# Patient Record
Sex: Female | Born: 1955 | Race: Black or African American | Hispanic: No | Smoking: Never smoker
Health system: Southern US, Community
[De-identification: ages and names within clinical notes are randomized; demographics above are authoritative.]

## PROBLEM LIST (undated history)

## (undated) DIAGNOSIS — F259 Schizoaffective disorder, unspecified: Secondary | ICD-10-CM

## (undated) DIAGNOSIS — D631 Anemia in chronic kidney disease: Secondary | ICD-10-CM

## (undated) DIAGNOSIS — F419 Anxiety disorder, unspecified: Secondary | ICD-10-CM

## (undated) DIAGNOSIS — E1169 Type 2 diabetes mellitus with other specified complication: Secondary | ICD-10-CM

## (undated) DIAGNOSIS — N189 Chronic kidney disease, unspecified: Secondary | ICD-10-CM

## (undated) DIAGNOSIS — E119 Type 2 diabetes mellitus without complications: Secondary | ICD-10-CM

## (undated) DIAGNOSIS — Z794 Long term (current) use of insulin: Secondary | ICD-10-CM

## (undated) DIAGNOSIS — F32A Depression, unspecified: Secondary | ICD-10-CM

## (undated) DIAGNOSIS — E1159 Type 2 diabetes mellitus with other circulatory complications: Secondary | ICD-10-CM

## (undated) DIAGNOSIS — I5032 Chronic diastolic (congestive) heart failure: Secondary | ICD-10-CM

## (undated) DIAGNOSIS — N183 Chronic kidney disease, stage 3 unspecified: Secondary | ICD-10-CM

## (undated) DIAGNOSIS — I152 Hypertension secondary to endocrine disorders: Secondary | ICD-10-CM

---

## 2016-02-18 HISTORY — PX: OTHER SURGICAL HISTORY: SHX169

## 2016-11-04 DIAGNOSIS — N1832 Chronic kidney disease, stage 3b: Secondary | ICD-10-CM | POA: Diagnosis present

## 2017-03-09 DIAGNOSIS — E119 Type 2 diabetes mellitus without complications: Secondary | ICD-10-CM

## 2017-03-09 DIAGNOSIS — E1159 Type 2 diabetes mellitus with other circulatory complications: Secondary | ICD-10-CM | POA: Diagnosis present

## 2017-03-09 DIAGNOSIS — I152 Hypertension secondary to endocrine disorders: Secondary | ICD-10-CM | POA: Diagnosis present

## 2019-11-22 ENCOUNTER — Emergency Department: Payer: Medicaid Other

## 2019-11-22 ENCOUNTER — Inpatient Hospital Stay
Admission: EM | Admit: 2019-11-22 | Discharge: 2019-11-27 | DRG: 177 | Disposition: A | Discharge status: Home Health | Payer: Medicaid Other | Attending: Internal Medicine | Admitting: Internal Medicine

## 2019-11-22 ENCOUNTER — Other Ambulatory Visit: Payer: Self-pay

## 2019-11-22 DIAGNOSIS — N141 Nephropathy induced by other drugs, medicaments and biological substances: Secondary | ICD-10-CM | POA: Diagnosis not present

## 2019-11-22 DIAGNOSIS — Z833 Family history of diabetes mellitus: Secondary | ICD-10-CM

## 2019-11-22 DIAGNOSIS — L8990 Pressure ulcer of unspecified site, unspecified stage: Secondary | ICD-10-CM | POA: Insufficient documentation

## 2019-11-22 DIAGNOSIS — E1169 Type 2 diabetes mellitus with other specified complication: Secondary | ICD-10-CM | POA: Diagnosis present

## 2019-11-22 DIAGNOSIS — E1122 Type 2 diabetes mellitus with diabetic chronic kidney disease: Secondary | ICD-10-CM | POA: Diagnosis present

## 2019-11-22 DIAGNOSIS — I152 Hypertension secondary to endocrine disorders: Secondary | ICD-10-CM | POA: Diagnosis present

## 2019-11-22 DIAGNOSIS — I5033 Acute on chronic diastolic (congestive) heart failure: Secondary | ICD-10-CM | POA: Diagnosis present

## 2019-11-22 DIAGNOSIS — Z79899 Other long term (current) drug therapy: Secondary | ICD-10-CM

## 2019-11-22 DIAGNOSIS — Z794 Long term (current) use of insulin: Secondary | ICD-10-CM

## 2019-11-22 DIAGNOSIS — Z6841 Body Mass Index (BMI) 40.0 and over, adult: Secondary | ICD-10-CM | POA: Diagnosis not present

## 2019-11-22 DIAGNOSIS — T508X5A Adverse effect of diagnostic agents, initial encounter: Secondary | ICD-10-CM | POA: Diagnosis not present

## 2019-11-22 DIAGNOSIS — U071 COVID-19: Principal | ICD-10-CM

## 2019-11-22 DIAGNOSIS — J189 Pneumonia, unspecified organism: Secondary | ICD-10-CM

## 2019-11-22 DIAGNOSIS — F419 Anxiety disorder, unspecified: Secondary | ICD-10-CM | POA: Diagnosis present

## 2019-11-22 DIAGNOSIS — L89623 Pressure ulcer of left heel, stage 3: Secondary | ICD-10-CM | POA: Diagnosis present

## 2019-11-22 DIAGNOSIS — I130 Hypertensive heart and chronic kidney disease with heart failure and stage 1 through stage 4 chronic kidney disease, or unspecified chronic kidney disease: Secondary | ICD-10-CM | POA: Diagnosis present

## 2019-11-22 DIAGNOSIS — R55 Syncope and collapse: Secondary | ICD-10-CM | POA: Diagnosis present

## 2019-11-22 DIAGNOSIS — J1282 Pneumonia due to coronavirus disease 2019: Secondary | ICD-10-CM | POA: Diagnosis present

## 2019-11-22 DIAGNOSIS — F259 Schizoaffective disorder, unspecified: Secondary | ICD-10-CM | POA: Diagnosis present

## 2019-11-22 DIAGNOSIS — J9601 Acute respiratory failure with hypoxia: Secondary | ICD-10-CM

## 2019-11-22 DIAGNOSIS — F32A Depression, unspecified: Secondary | ICD-10-CM | POA: Diagnosis present

## 2019-11-22 DIAGNOSIS — X58XXXA Exposure to other specified factors, initial encounter: Secondary | ICD-10-CM | POA: Diagnosis present

## 2019-11-22 DIAGNOSIS — N1832 Chronic kidney disease, stage 3b: Secondary | ICD-10-CM | POA: Diagnosis present

## 2019-11-22 DIAGNOSIS — R319 Hematuria, unspecified: Secondary | ICD-10-CM | POA: Diagnosis not present

## 2019-11-22 DIAGNOSIS — E1165 Type 2 diabetes mellitus with hyperglycemia: Secondary | ICD-10-CM | POA: Diagnosis present

## 2019-11-22 DIAGNOSIS — D638 Anemia in other chronic diseases classified elsewhere: Secondary | ICD-10-CM | POA: Diagnosis present

## 2019-11-22 DIAGNOSIS — E1159 Type 2 diabetes mellitus with other circulatory complications: Secondary | ICD-10-CM | POA: Diagnosis present

## 2019-11-22 DIAGNOSIS — N179 Acute kidney failure, unspecified: Secondary | ICD-10-CM | POA: Diagnosis not present

## 2019-11-22 DIAGNOSIS — I5031 Acute diastolic (congestive) heart failure: Secondary | ICD-10-CM | POA: Diagnosis not present

## 2019-11-22 DIAGNOSIS — E119 Type 2 diabetes mellitus without complications: Secondary | ICD-10-CM

## 2019-11-22 DIAGNOSIS — E785 Hyperlipidemia, unspecified: Secondary | ICD-10-CM | POA: Diagnosis present

## 2019-11-22 HISTORY — DX: Chronic diastolic (congestive) heart failure: I50.32

## 2019-11-22 HISTORY — DX: Chronic kidney disease, stage 3 unspecified: N18.30

## 2019-11-22 HISTORY — DX: Anemia in chronic kidney disease: D63.1

## 2019-11-22 HISTORY — DX: Anxiety disorder, unspecified: F41.9

## 2019-11-22 HISTORY — DX: Type 2 diabetes mellitus without complications: E11.9

## 2019-11-22 HISTORY — DX: Depression, unspecified: F32.A

## 2019-11-22 HISTORY — DX: Hypertension secondary to endocrine disorders: I15.2

## 2019-11-22 HISTORY — DX: Anemia in chronic kidney disease: N18.9

## 2019-11-22 HISTORY — DX: Schizoaffective disorder, unspecified: F25.9

## 2019-11-22 HISTORY — DX: Type 2 diabetes mellitus with other circulatory complications: E11.59

## 2019-11-22 HISTORY — DX: Type 2 diabetes mellitus with other specified complication: E11.69

## 2019-11-22 HISTORY — DX: Long term (current) use of insulin: Z79.4

## 2019-11-22 LAB — COMPREHENSIVE METABOLIC PANEL
ALT: 11 U/L (ref 0–44)
AST: 17 U/L (ref 15–41)
Albumin: 3.2 g/dL — ABNORMAL LOW (ref 3.5–5.0)
Alkaline Phosphatase: 51 U/L (ref 38–126)
Anion gap: 10 (ref 5–15)
BUN: 25 mg/dL — ABNORMAL HIGH (ref 8–23)
CO2: 26 mmol/L (ref 22–32)
Calcium: 8.9 mg/dL (ref 8.9–10.3)
Chloride: 102 mmol/L (ref 98–111)
Creatinine, Ser: 1.79 mg/dL — ABNORMAL HIGH (ref 0.44–1.00)
GFR, Estimated: 32 mL/min — ABNORMAL LOW (ref >60)
Glucose, Bld: 485 mg/dL — ABNORMAL HIGH (ref 70–99)
Potassium: 4.5 mmol/L (ref 3.5–5.1)
Sodium: 138 mmol/L (ref 135–145)
Total Bilirubin: 0.8 mg/dL (ref 0.3–1.2)
Total Protein: 7.3 g/dL (ref 6.5–8.1)

## 2019-11-22 LAB — CBC WITH DIFFERENTIAL/PLATELET
Abs Immature Granulocytes: 0.04 10*3/uL (ref 0.00–0.07)
Basophils Absolute: 0 10*3/uL (ref 0.0–0.1)
Basophils Relative: 0 %
Eosinophils Absolute: 0.1 10*3/uL (ref 0.0–0.5)
Eosinophils Relative: 1 %
HCT: 29.5 % — ABNORMAL LOW (ref 36.0–46.0)
Hemoglobin: 9 g/dL — ABNORMAL LOW (ref 12.0–15.0)
Immature Granulocytes: 1 %
Lymphocytes Relative: 25 %
Lymphs Abs: 2 10*3/uL (ref 0.7–4.0)
MCH: 26.8 pg (ref 26.0–34.0)
MCHC: 30.5 g/dL (ref 30.0–36.0)
MCV: 87.8 fL (ref 80.0–100.0)
Monocytes Absolute: 0.6 10*3/uL (ref 0.1–1.0)
Monocytes Relative: 7 %
Neutro Abs: 5.4 10*3/uL (ref 1.7–7.7)
Neutrophils Relative %: 66 %
Platelets: 294 10*3/uL (ref 150–400)
RBC: 3.36 MIL/uL — ABNORMAL LOW (ref 3.87–5.11)
RDW: 16.2 % — ABNORMAL HIGH (ref 11.5–15.5)
WBC: 8 10*3/uL (ref 4.0–10.5)
nRBC: 0 % (ref 0.0–0.2)

## 2019-11-22 LAB — FIBRINOGEN: Fibrinogen: 519 mg/dL — ABNORMAL HIGH (ref 210–475)

## 2019-11-22 LAB — FIBRIN DERIVATIVES D-DIMER (ARMC ONLY): Fibrin derivatives D-dimer (ARMC): 1385.48 ng/mL (FEU) — ABNORMAL HIGH (ref 0.00–499.00)

## 2019-11-22 LAB — LACTATE DEHYDROGENASE: LDH: 250 U/L — ABNORMAL HIGH (ref 98–192)

## 2019-11-22 LAB — PROTIME-INR
INR: 0.9 (ref 0.8–1.2)
Prothrombin Time: 11.9 seconds (ref 11.4–15.2)

## 2019-11-22 LAB — PROCALCITONIN: Procalcitonin: <0.1 ng/mL

## 2019-11-22 LAB — LACTIC ACID, PLASMA: Lactic Acid, Venous: 1.4 mmol/L (ref 0.5–1.9)

## 2019-11-22 LAB — BRAIN NATRIURETIC PEPTIDE: B Natriuretic Peptide: 269.3 pg/mL — ABNORMAL HIGH (ref 0.0–100.0)

## 2019-11-22 LAB — FERRITIN: Ferritin: 86 ng/mL (ref 11–307)

## 2019-11-22 LAB — TRIGLYCERIDES: Triglycerides: 161 mg/dL — ABNORMAL HIGH (ref <150)

## 2019-11-22 LAB — APTT: aPTT: 28 seconds (ref 24–36)

## 2019-11-22 MED ORDER — HYDROCOD POLST-CPM POLST ER 10-8 MG/5ML PO SUER: 5.0000 mL | Freq: Two times a day (BID) | ORAL | Status: DC | PRN

## 2019-11-22 MED ORDER — SODIUM CHLORIDE 0.9 % IV SOLN
500.0000 mg | INTRAVENOUS | Status: DC
  Administered 2019-11-22: 500 mg via INTRAVENOUS
  Filled 2019-11-22: qty 500

## 2019-11-22 MED ORDER — ONDANSETRON HCL 4 MG PO TABS: 4.0000 mg | ORAL_TABLET | Freq: Four times a day (QID) | ORAL | Status: DC | PRN

## 2019-11-22 MED ORDER — ENOXAPARIN SODIUM 40 MG/0.4ML ~~LOC~~ SOLN: 40.0000 mg | SUBCUTANEOUS | Status: DC

## 2019-11-22 MED ORDER — SODIUM CHLORIDE 0.9 % IV BOLUS (SEPSIS)
1000.0000 mL | Freq: Once | INTRAVENOUS | Status: AC
  Administered 2019-11-22: 1000 mL via INTRAVENOUS

## 2019-11-22 MED ORDER — SODIUM CHLORIDE 0.9% FLUSH: 3.0000 mL | Freq: Two times a day (BID) | INTRAVENOUS | Status: DC

## 2019-11-22 MED ORDER — IPRATROPIUM-ALBUTEROL 20-100 MCG/ACT IN AERS
1.0000 | INHALATION_SPRAY | Freq: Four times a day (QID) | RESPIRATORY_TRACT | Status: DC | PRN
  Filled 2019-11-22 (×2): qty 4

## 2019-11-22 MED ORDER — ACETAMINOPHEN 650 MG RE SUPP: 650.0000 mg | Freq: Four times a day (QID) | RECTAL | Status: DC | PRN

## 2019-11-22 MED ORDER — GUAIFENESIN-DM 100-10 MG/5ML PO SYRP: 5.0000 mL | ORAL_SOLUTION | ORAL | Status: DC | PRN

## 2019-11-22 MED ORDER — LABETALOL HCL 5 MG/ML IV SOLN
10.0000 mg | Freq: Once | INTRAVENOUS | Status: AC
  Administered 2019-11-23: 10 mg via INTRAVENOUS
  Filled 2019-11-22: qty 4

## 2019-11-22 MED ORDER — RISPERIDONE 1 MG PO TABS
1.0000 mg | ORAL_TABLET | Freq: Two times a day (BID) | ORAL | Status: DC
  Administered 2019-11-23 – 2019-11-27 (×9): 1 mg via ORAL
  Filled 2019-11-22 (×10): qty 1

## 2019-11-22 MED ORDER — IOHEXOL 350 MG/ML SOLN
60.0000 mL | Freq: Once | INTRAVENOUS | Status: AC | PRN
  Administered 2019-11-22: 60 mL via INTRAVENOUS

## 2019-11-22 MED ORDER — METHYLPREDNISOLONE SODIUM SUCC 125 MG IJ SOLR
60.0000 mg | Freq: Two times a day (BID) | INTRAMUSCULAR | Status: DC
  Administered 2019-11-23: 60 mg via INTRAVENOUS
  Filled 2019-11-22: qty 2

## 2019-11-22 MED ORDER — ASCORBIC ACID 500 MG PO TABS
1000.0000 mg | ORAL_TABLET | Freq: Every day | ORAL | Status: DC
  Administered 2019-11-23 – 2019-11-27 (×5): 1000 mg via ORAL
  Filled 2019-11-22 (×5): qty 2

## 2019-11-22 MED ORDER — ZINC SULFATE 220 (50 ZN) MG PO CAPS
220.0000 mg | ORAL_CAPSULE | Freq: Every day | ORAL | Status: DC
  Administered 2019-11-23 – 2019-11-27 (×5): 220 mg via ORAL
  Filled 2019-11-22 (×5): qty 1

## 2019-11-22 MED ORDER — METHYLPREDNISOLONE SODIUM SUCC 125 MG IJ SOLR
125.0000 mg | Freq: Once | INTRAMUSCULAR | Status: AC
  Administered 2019-11-22: 125 mg via INTRAVENOUS
  Filled 2019-11-22: qty 2

## 2019-11-22 MED ORDER — FUROSEMIDE 10 MG/ML IJ SOLN
40.0000 mg | Freq: Two times a day (BID) | INTRAMUSCULAR | Status: DC
  Administered 2019-11-23: 40 mg via INTRAVENOUS
  Filled 2019-11-22: qty 4

## 2019-11-22 MED ORDER — ONDANSETRON HCL 4 MG/2ML IJ SOLN: 4.0000 mg | Freq: Four times a day (QID) | INTRAMUSCULAR | Status: DC | PRN

## 2019-11-22 MED ORDER — CARVEDILOL 12.5 MG PO TABS
12.5000 mg | ORAL_TABLET | Freq: Two times a day (BID) | ORAL | Status: DC
  Administered 2019-11-23 – 2019-11-27 (×9): 12.5 mg via ORAL
  Filled 2019-11-22: qty 2
  Filled 2019-11-22 (×8): qty 1

## 2019-11-22 MED ORDER — VENLAFAXINE HCL ER 75 MG PO CP24
75.0000 mg | ORAL_CAPSULE | Freq: Every day | ORAL | Status: DC
  Administered 2019-11-23 – 2019-11-27 (×5): 75 mg via ORAL
  Filled 2019-11-22 (×5): qty 1

## 2019-11-22 MED ORDER — DIVALPROEX SODIUM ER 500 MG PO TB24
1000.0000 mg | ORAL_TABLET | Freq: Every day | ORAL | Status: DC
  Administered 2019-11-23 – 2019-11-26 (×5): 1000 mg via ORAL
  Filled 2019-11-22 (×4): qty 2
  Filled 2019-11-22: qty 4
  Filled 2019-11-22: qty 2

## 2019-11-22 MED ORDER — ATORVASTATIN CALCIUM 20 MG PO TABS
80.0000 mg | ORAL_TABLET | Freq: Every day | ORAL | Status: DC
  Administered 2019-11-23 – 2019-11-26 (×5): 80 mg via ORAL
  Filled 2019-11-22 (×5): qty 4

## 2019-11-22 MED ORDER — INSULIN GLARGINE 100 UNIT/ML ~~LOC~~ SOLN
50.0000 [IU] | Freq: Every day | SUBCUTANEOUS | Status: DC
  Administered 2019-11-23: 50 [IU] via SUBCUTANEOUS
  Filled 2019-11-22 (×2): qty 0.5

## 2019-11-22 MED ORDER — LISINOPRIL 20 MG PO TABS
40.0000 mg | ORAL_TABLET | Freq: Every day | ORAL | Status: DC
  Administered 2019-11-23 – 2019-11-24 (×2): 40 mg via ORAL
  Filled 2019-11-22: qty 4
  Filled 2019-11-22: qty 2

## 2019-11-22 MED ORDER — INSULIN ASPART 100 UNIT/ML ~~LOC~~ SOLN
0.0000 [IU] | Freq: Every day | SUBCUTANEOUS | Status: DC
  Administered 2019-11-23: 5 [IU] via SUBCUTANEOUS
  Administered 2019-11-24: 4 [IU] via SUBCUTANEOUS
  Administered 2019-11-25 – 2019-11-26 (×2): 2 [IU] via SUBCUTANEOUS
  Filled 2019-11-22 (×4): qty 1

## 2019-11-22 MED ORDER — SODIUM CHLORIDE 0.9 % IV SOLN
2.0000 g | INTRAVENOUS | Status: DC
  Administered 2019-11-22: 2 g via INTRAVENOUS
  Filled 2019-11-22: qty 20

## 2019-11-22 MED ORDER — SENNOSIDES-DOCUSATE SODIUM 8.6-50 MG PO TABS: 1.0000 | ORAL_TABLET | Freq: Every evening | ORAL | Status: DC | PRN

## 2019-11-22 MED ORDER — ACETAMINOPHEN 325 MG PO TABS
650.0000 mg | ORAL_TABLET | Freq: Four times a day (QID) | ORAL | Status: DC | PRN
  Administered 2019-11-23: 650 mg via ORAL
  Filled 2019-11-22: qty 2

## 2019-11-22 MED ORDER — INSULIN ASPART 100 UNIT/ML ~~LOC~~ SOLN
0.0000 [IU] | Freq: Three times a day (TID) | SUBCUTANEOUS | Status: DC
  Administered 2019-11-23: 15 [IU] via SUBCUTANEOUS
  Administered 2019-11-23: 30 [IU] via SUBCUTANEOUS
  Administered 2019-11-24: 11 [IU] via SUBCUTANEOUS
  Administered 2019-11-24: 4 [IU] via SUBCUTANEOUS
  Administered 2019-11-24: 7 [IU] via SUBCUTANEOUS
  Administered 2019-11-25: 20 [IU] via SUBCUTANEOUS
  Administered 2019-11-25: 15 [IU] via SUBCUTANEOUS
  Administered 2019-11-25: 11 [IU] via SUBCUTANEOUS
  Administered 2019-11-26: 3 [IU] via SUBCUTANEOUS
  Administered 2019-11-26: 7 [IU] via SUBCUTANEOUS
  Administered 2019-11-26: 3 [IU] via SUBCUTANEOUS
  Administered 2019-11-27: 4 [IU] via SUBCUTANEOUS
  Filled 2019-11-22 (×13): qty 1

## 2019-11-22 NOTE — Progress Notes (Signed)
CODE SEPSIS - PHARMACY COMMUNICATION  **Broad Spectrum Antibiotics should be administered within 1 hour of Sepsis diagnosis**  Time Code Sepsis Called/Page Received: 1826  Antibiotics Ordered: ceftriaxone/azithromycin  Time of 1st antibiotic administration: 1910  Additional action taken by pharmacy: NA  If necessary, Name of Provider/Nurse Contacted: NA    Pricilla Riffle ,PharmD Clinical Pharmacist  11/22/2019  7:15 PM

## 2019-11-22 NOTE — ED Provider Notes (Signed)
[facility] Emergency Department Provider Note  ____________________________________________  Time seen: Approximately 6:33 PM  I have reviewed the triage vital signs and the nursing notes.   HISTORY  Chief Complaint Loss of Consciousness  Level 5 Caveat: Portions of the History and Physical including HPI and review of systems are unable to be completely obtained due to patient being a poor historian    HPI [name] is a 64 y.o. female with a history of diabetes, CKD, and recent Covid infection diagnosed on October 06, 2019 at [facility] who comes ED complaining of syncope this afternoon.  Family also reports the patient seeming short of breath today.  They note a recent Covid diagnosis.  Patient has had decreased oral intake over the last few days.  No fever, no chest pain, no lateralizing weakness paresthesias or vision changes.      Allergies  No known active allergies Medications Reconcile with Patient's Chart Medication Sig Dispensed Refills Start Date End Date Status  risperiDONE (RISPERDAL) 1 MG tablet  Take 1 mg by mouth 2 (two) times daily.  0   Active  risperiDONE microspheres (RISPERDAL CONSTA) 37.5 mg/2 mL injection  Inject 37.5 mg into the muscle every 14 (fourteen) days.  0   Active  mirtazapine (REMERON) 15 MG tablet  Take 15 mg by mouth nightly.  0   Active  divalproex (DEPAKOTE) 500 MG 24 hr tablet  Take 1,000 mg by mouth nightly. (1,074m) by mouth nightly.  0   Active  blood sugar diagnostic (ACCU-CHEK GUIDE TEST STRIPS) Strp  Indications: Uncontrolled type 2 diabetes mellitus with hyperglycemia (HCC) 1 strip by Misc.(Non-Drug; Combo Route) route 2 (two) times daily. DX: E11.9 100 strip  11 11/18/2018  Active  lancing device with lancets (ACCU-CHEK FASTCLIX LANCING DEV) Kit  Indications: Uncontrolled type 2 diabetes mellitus with hyperglycemia (HCC) 1 Device by Misc.(Non-Drug; Combo Route)  route 2 (two) times daily. DX: E11.9 100 each  11 11/18/2018  Active  blood-glucose meter (ACCU-CHEK GUIDE GLUCOSE METER) Misc  Indications: Uncontrolled type 2 diabetes mellitus with hyperglycemia (HCC) Use two times daily with test strips DX: E11.9 1 each  0 11/18/2018  Active  pen needle, diabetic (BD NANO 2ND GEN PEN NEEDLE) 32 gauge x 5/32" PenNeedle  Indications: Type 2 diabetes mellitus with other specified complication, with long-term current use of insulin ([place] 1 each by Misc.(Non-Drug; Combo Route) route 3 (three) times daily. 100 each  3 02/02/2019  Active  ACCU-CHEK FASTCLIX LANCET DRUM Misc  USE 1 TO CHECK GLUCOSE TWICE DAILY  0 11/18/2018  Active  glipiZIDE (GLUCOTROL) 5 MG 24 hr tablet  Indications: Type 2 diabetes mellitus with other specified complication, with long-term current use of insulin (HCC) Take 1 tablet (5 mg total) by mouth daily. 90 tablet  3 02/03/2019 02/03/2020 Active  atorvastatin (LIPITOR) 80 MG tablet  Take 1 tablet (80 mg total) by mouth daily. 90 tablet  4 07/13/2019 07/12/2020 Active  insulin glargine (LANTUS SOLOSTAR U-100 INSULIN) 100 unit/mL (3 mL) InslnPen pen  Inject 60 Units into the skin daily. 45 mL  2 07/22/2019  Active  dulaglutide (TRULICITY) 3 mDE/0.8mL injector pen  Indications: Type 2 diabetes mellitus with other specified complication, with long-term current use of insulin (HCC) Inject 0.5 mLs (3 mg total) into the skin every 7 days. 6 mL  3 08/05/2019  Active  lisinopriL (PRINIVIL) 40 MG tablet  Take 40 mg by mouth daily.  0   Active  venlafaxine er (EFFEXOR-XR) 75 MG 24 hr capsule  Take 75 mg by mouth daily.  0   Active  carvediloL (COREG) 12.5 MG tablet  Take 1 tablet (12.5 mg total) by mouth 2 (two) times daily with meals. Take 2 tablets (25 mg) by mouth 2 times daily. 60 tablet  0 10/18/2019  Active  furosemide (LASIX) 20 MG tablet  Take 2 tablets (40 mg total) by mouth daily. 60 tablet  0 10/18/2019  Active   sodium zirconium cyclosilicate (LOKELMA) 10 gram PwdrnPckt powder for suspension  Take 10 g by mouth daily. 300 g  0 10/19/2019 11/18/2019 Expired  Active Problems Reconcile with Patient's Chart Problem Noted Date  COVID-19 virus infection 10/17/2019  Acute kidney injury 10/11/2019  Hyperkalemia 10/11/2019  Acute hypoxemic respiratory failure due to COVID-19 10/06/2019  Diabetic nephropathy 09/23/2018  Overview:   Formatting of this note might be different from the original. -follows with nephrology and Endocrinology -last hemoglobin A1c > 14% -discussed at length with patient the importance of adherence to insulin regimen for improvement in her kidney function   Microalbuminuria 09/15/2018  Vitamin D deficiency 04/07/2017  Overview:   Formatting of this note might be different from the original. -vitamin D levels appropriate 09/22/2018   Type 2 diabetes mellitus with other specified complication, with long-term current use of insulin 03/09/2017  Hypertension associated with diabetes 03/09/2017  Overview:   Formatting of this note might be different from the original. -blood pressure at goal today -continue medications   Uncontrolled type 2 diabetes mellitus with hyperglycemia 03/04/2017  Hydronephrosis 01/22/2016  Overview:   Formatting of this note might be different from the original. No evidence of hydronephrosis on recent   Hematuria 01/22/2016  Last Assessment & Plan:   Formatting of this note might be different from the original. Secondary to the patient's chronic kidney disease we will arrange patient undergo cystoscopy, bilateral retrograde pyelograms, possible bladder biopsy and all other indicated procedures with vaginal examination under anesthesia   CKD stage 3 secondary to diabetes 01/22/2016  Overview:   Formatting of this note might be different from the original. -seen by Dr. Selinda Flavin 09/22/2018 and lab work within normal limits -creatinine at baseline  of 1.4 yesterday -avoid nephrotoxic medications and continue to follow-up with nephrology, no indication for renal biopsy at this time -most important thing is to get better control of her blood glucose   Urge urinary incontinence 01/22/2016  Overview:   Formatting of this note might be different from the original. Dressing urge urinary incontinence following hematuria evaluation   Anemia, unspecified 01/24/2015  Edema 01/16/2015  Overview:   Formatting of this note might be different from the original. Last Assessment & Plan:  Stable on current dose lasix       Patient Active Problem List   Diagnosis Date Noted  . Acute hypoxemic respiratory failure due to COVID-19 [facility]) 11/22/2019        Prior to Admission medications   Medication Sig Start Date End Date Taking? Authorizing Provider  atorvastatin (LIPITOR) 80 MG tablet Take 80 mg by mouth at bedtime. 06/17/19  Yes [provider]  carvedilol (COREG) 12.5 MG tablet Take 12.5 mg by mouth in the morning and at bedtime. 08/24/19  Yes [provider]  Cholecalciferol (VITAMIN D3) 125 MCG (5000 UT) TABS Take 1 tablet by mouth daily. 11/18/19  Yes [provider]  divalproex (DEPAKOTE ER) 500 MG 24 hr tablet Take 1,000 mg by mouth at bedtime. 11/18/19  Yes [provider]  furosemide (LASIX) 20 MG tablet Take 40 mg by mouth daily. 11/18/19  Yes [provider]  lisinopril (ZESTRIL) 40 MG tablet Take 40 mg by mouth daily. 06/05/19  Yes [provider]  risperiDONE (RISPERDAL) 1 MG tablet Take 1 mg by mouth 2 (two) times daily. 08/24/19  Yes [provider]  TRULICITY 3 SL/3.7DS SOPN Inject 3 mg into the muscle every 7 (seven) days. 09/16/19  Yes [provider]  venlafaxine XR (EFFEXOR-XR) 75 MG 24 hr capsule Take 75 mg by mouth daily. 08/24/19  Yes [provider]  vitamin B-12 (CYANOCOBALAMIN) 250 MCG tablet Take 250 mcg by mouth daily.   Yes [provider]     Allergies Patient has no known allergies.   No family history on file.  Social History Social History   Tobacco Use  . Smoking status: Not on file  Substance Use Topics  . Alcohol use: Not on file  . Drug use: Not on file    Review of Systems  Constitutional:   No fever or chills.  ENT:   No sore throat. No rhinorrhea. Cardiovascular:   No chest pain, positive syncope. Respiratory: Positive shortness of breath. Gastrointestinal:   Negative for abdominal pain, vomiting and diarrhea.  Musculoskeletal:   Negative for focal pain or swelling All other systems reviewed and are negative except as documented above in ROS and HPI.  ____________________________________________   PHYSICAL EXAM:  VITAL SIGNS: ED Triage Vitals  Enc Vitals Group     BP 11/22/19 1824 (!) 215/140     Pulse Rate 11/22/19 1824 78     Resp 11/22/19 1824 (!) 27     Temp 11/22/19 1824 98.2 F (36.8 C)     Temp Source 11/22/19 1824 Oral     SpO2 11/22/19 1824 (!) 84 %     Weight 11/22/19 1826 246 lb (111.6 kg)     Height 11/22/19 1826 4' 11"  (1.499 m)     Head Circumference --      Peak Flow --      Pain Score 11/22/19 1825 7     Pain Loc --      Pain Edu? --      Excl. in [place]? --     Vital signs reviewed, nursing assessments reviewed.   Constitutional:   Alert and oriented. Non-toxic appearance. Eyes:   Conjunctivae are normal. EOMI. PERRL. ENT      Head:   Normocephalic and atraumatic.      Nose:   Wearing a mask.      Mouth/Throat:   Wearing a mask.      Neck:   No meningismus. Full ROM. Hematological/Lymphatic/Immunilogical:   No cervical lymphadenopathy. Cardiovascular:   RRR. Symmetric bilateral radial and DP pulses.  No murmurs. Cap refill less than 2 seconds. Respiratory: Oxygen saturation 82% on room air.  Increases to 99% on 3 L nasal cannula.  Tachypnea with a respiratory rate of about 25.  Diffuse bilateral crackles in the middle and lower lung fields.   Symmetric air movement. Gastrointestinal:   Soft and nontender. Non distended. There is no CVA tenderness.  No rebound, rigidity, or guarding.  Musculoskeletal:   Normal range of motion in all extremities. No joint effusions.  No lower extremity tenderness.  No edema. Neurologic:   Normal speech and language.  Motor grossly intact. No acute focal neurologic deficits are appreciated.  Skin:    Skin is warm, dry and intact. No rash noted.  No petechiae, purpura, or bullae.  ____________________________________________    LABS (pertinent positives/negatives) (all labs ordered are listed, but only abnormal results are displayed) Labs Reviewed  COMPREHENSIVE METABOLIC PANEL - Abnormal; Notable for the following components:      Result Value   Glucose, Bld 485 (*)    BUN 25 (*)    Creatinine, Ser 1.79 (*)    Albumin 3.2 (*)    GFR, Estimated 32 (*)    All other components within normal limits  CBC WITH DIFFERENTIAL/PLATELET - Abnormal; Notable for the following components:   RBC 3.36 (*)    Hemoglobin 9.0 (*)    HCT 29.5 (*)    RDW 16.2 (*)    All other components within normal limits  CULTURE, BLOOD (ROUTINE X 2)  CULTURE, BLOOD (ROUTINE X 2)  URINE CULTURE  LACTIC ACID, PLASMA  PROTIME-INR  APTT  URINALYSIS, COMPLETE (UACMP) WITH MICROSCOPIC  PROCALCITONIN  FIBRIN DERIVATIVES D-DIMER ([facility] ONLY)  TRIGLYCERIDES  FIBRINOGEN  C-REACTIVE PROTEIN  FERRITIN  LACTATE DEHYDROGENASE   ____________________________________________   EKG  Interpreted by me Sinus rhythm rate of 77, normal axis and intervals.  Normal QRS ST segments and T waves.  ____________________________________________    RADIOLOGY  CT Angio Chest PE W and/or Wo Contrast  Result Date: 11/22/2019 CLINICAL DATA:  Syncopal episode, history of COVID-19 diagnosis EXAM: CT ANGIOGRAPHY CHEST WITH CONTRAST TECHNIQUE: Multidetector CT imaging of the chest was performed using the standard protocol during bolus  administration of intravenous contrast. Multiplanar CT image reconstructions and MIPs were obtained to evaluate the vascular anatomy. CONTRAST:  21m OMNIPAQUE IOHEXOL 350 MG/ML SOLN COMPARISON:  Chest x-ray from earlier in the same day. FINDINGS: Cardiovascular: Thoracic aorta shows no aneurysmal dilatation or dissection. Pulmonary artery shows a normal branching pattern. No definitive filling defect to suggest pulmonary embolism is seen. No significant coronary calcifications are noted. Mild cardiomegaly is noted. Mediastinum/Nodes: Thoracic inlet is within normal limits. Scattered small hilar and mediastinal lymph nodes are noted likely reactive in nature. The esophagus as visualized is within normal limits. Lungs/Pleura: Lungs are well aerated bilaterally. Some interstitial edema is noted as well as patchy airspace opacities bilaterally consistent with the given clinical history of COVID-19 positivity. Bilateral pleural effusions are noted. Mild lower lobe atelectatic changes are noted. Upper Abdomen: Visualized upper abdomen is unremarkable. Musculoskeletal: Degenerative changes of the thoracic spine are noted. Review of the MIP images confirms the above findings. IMPRESSION: Patchy airspace opacities consistent with the given clinical history of COVID-19 positivity. Bilateral pleural effusions as well as interstitial edema consistent with superimposed CHF. No evidence of pulmonary emboli. Electronically Signed   By: MInez CatalinaM.D.   On: 11/22/2019 20:24   DG Chest Port 1 View  Result Date: 11/22/2019 CLINICAL DATA:  Questionable sepsis - evaluate for abnormality Syncope.  Recent COVID. EXAM: PORTABLE CHEST 1 VIEW COMPARISON:  None. FINDINGS: Lung volumes are low. Mild cardiomegaly. Patchy heterogeneous bilateral airspace opacities in a mid-lower lung zone predominant distribution. There also hazy opacities at the lung bases that may represent small effusions. No pneumothorax. No acute osseous  abnormalities are seen. IMPRESSION: 1. Patchy bilateral airspace disease typical of COVID-19 pneumonia. 2. Additional hazy opacity at the lung bases may represent small effusions. Mild cardiomegaly. Electronically Signed   By: MKeith RakeM.D.   On: 11/22/2019 18:41    ____________________________________________   PROCEDURES .Critical Care Performed by: SCarrie Mew MD Authorized by: SCarrie Mew MD   Critical care provider statement:  Critical care time (minutes):  35   Critical care time was exclusive of:  Separately billable procedures and treating other patients   Critical care was necessary to treat or prevent imminent or life-threatening deterioration of the following conditions:  Sepsis and respiratory failure   Critical care was time spent personally by me on the following activities:  Development of treatment plan with patient or surrogate, discussions with consultants, evaluation of patient's response to treatment, examination of patient, obtaining history from patient or surrogate, ordering and performing treatments and interventions, ordering and review of laboratory studies, ordering and review of radiographic studies, pulse oximetry, re-evaluation of patient's condition and review of old charts    ____________________________________________  DIFFERENTIAL DIAGNOSIS   Bacterial pneumonia, sepsis, recrudescent Covid pneumonia, pulmonary embolism, pleural effusion, pulmonary edema  CLINICAL IMPRESSION / ASSESSMENT AND PLAN / ED COURSE  Medications ordered in the ED: Medications  cefTRIAXone (ROCEPHIN) 2 g in sodium chloride 0.9 % 100 mL IVPB (0 g Intravenous Stopped 11/22/19 2044)  azithromycin (ZITHROMAX) 500 mg in sodium chloride 0.9 % 250 mL IVPB (500 mg Intravenous New Bag/Given 11/22/19 2043)  sodium chloride 0.9 % bolus 1,000 mL (1,000 mLs Intravenous New Bag/Given 11/22/19 1910)  methylPREDNISolone sodium succinate (SOLU-MEDROL) 125 mg/2 mL injection  125 mg (125 mg Intravenous Given 11/22/19 1940)  iohexol (OMNIPAQUE) 350 MG/ML injection 60 mL (60 mLs Intravenous Contrast Given 11/22/19 2005)    Pertinent labs & imaging results that were available during my care of the patient were reviewed by me and considered in my medical decision making (see chart for details).  Patricia Bates was evaluated in Emergency Department on 11/22/2019 for the symptoms described in the history of present illness. She was evaluated in the context of the global COVID-19 pandemic, which necessitated consideration that the patient might be at risk for infection with the SARS-CoV-2 virus that causes COVID-19. Institutional protocols and algorithms that pertain to the evaluation of patients at risk for COVID-19 are in a state of rapid change based on information released by regulatory bodies including the CDC and federal and state organizations. These policies and algorithms were followed during the patient's care in the ED.     Clinical Course as of Nov 21 2140  Tue Nov 22, 2019  1826 Patient presents with tachypnea hypoxia tachycardia after recent hospitalization from [facility] on October 05, 2019.  Differential includes multifocal pneumonia, sepsis, pulmonary edema, pleural effusion, CHF exacerbation, pulmonary embolism.  Sepsis protocol initiated, will give ceftriaxone and azithromycin and 1 L IV fluid bolus while checking labs and chest x-ray.  May need to proceed with CT scan of the chest to rule out PE..   [PS]  1833 Chest x-ray image viewed by me which shows multifocal infiltrates.    [PS]  [id number] Radiology report on cxr agrees with multifocal infiltrates, suggests it could be due to covid. May be chronic / persistent finding from illness 6 weeks ago.   [PS]    Clinical Course User Index [PS] Carrie Mew, MD    ----------------------------------------- 9:40 PM on 11/22/2019 -----------------------------------------  CT angiogram performed which is negative  for PE, again demonstrates multifocal infiltrates concerning for Covid pneumonia.  Procalcitonin pending, inflammatory marker panel pending.  Patient has received antibiotics, no signs of septic shock.  Case discussed with hospitalist for further management.  Solu-Medrol given for possible Covid and inflammatory cascade exacerbating symptoms.   ____________________________________________   FINAL CLINICAL IMPRESSION(S) / ED DIAGNOSES    Final diagnoses:  Acute respiratory failure with  hypoxia ([place])  Multifocal pneumonia  Type 2 diabetes mellitus without complication, with long-term current use of insulin ([place])  Morbid obesity ([place])  Stage 3b chronic kidney disease [facility])     ED Discharge Orders    None      Portions of this note were generated with dragon dictation software. Dictation errors may occur despite best attempts at proofreading.   Carrie Mew, MD 11/22/19 2142

## 2019-11-22 NOTE — H&P (Signed)
History and Physical    Patricia Bates HQI:[id number] DOB: 1955-12-12 DOA: 11/22/2019  PCP: System, Provider Not In  Patient coming from: Home via EMS  I have personally briefly reviewed patient's old medical records in [facility]  Chief Complaint: Loss of consciousness, shortness of breath  HPI: Patricia Bates is a 64 y.o. female with medical history significant for chronic diastolic CHF (EF 13%, G1 DD by TTE 10/06/2019), T2DM, HTN, HLD, CKD stage III, anxiety/depression, schizoaffective disorder, and morbid obesity who presents to the ED for evaluation of shortness of breath and syncopal event.  Patient was recently hospitalized at [facility] from 10/05/2019-10/18/2019 for AKI on CKD stage III.  She was found to be COVID-19 positive on 9/16.  Per care everywhere documentation, she was initially asymptomatic however did develop supplemental O2 requirement of 2 L via [place].  She was treated short-term with Decadron and was weaned off oxygen prior to discharge.  Patient says she lives in [facility] [place] and is here visiting locally.  Patient states she was doing well since leaving the hospital.  Yesterday she began to have dyspnea on minimal exertion.  Shortness of breath with improved when she sat down to rest.  She has had associated nonproductive cough.  She reports subjective fevers and diaphoresis.  She has had palpitations but denies any chest pain.    She says she was sitting down earlier today when she became lightheaded and briefly blacked out.  She says she awoke shortly afterwards leaning forward in her chair but did not fall to the ground or injure herself.    She has noticed increased swelling to both of her legs.  She said she had some left upper quadrant abdominal discomfort which has since resolved.  She denies any nausea, vomiting, or dysuria.  She says she normally ambulates with the use of a walker.  ED Course:  Initial vitals showed BP  215/140, pulse 78, RR 27, temp 98.2 Fahrenheit, SPO2 84% on room air.  She was placed on 3 L supplemental O2 via [place] with improved SPO2 >98%.  Labs show WBC 8.0, hemoglobin 9.0, platelets 294,000, sodium 138, potassium 4.5, bicarb 26, BUN 25, creatinine 1.79, serum glucose 45, LFTs within normal limits, lactic acid 1.4.  Blood cultures were obtained and pending.  Pleural chest x-ray showed patchy bilateral airspace disease with small effusions at the lung bases.  CTA chest PE study showed patchy bilateral airspace opacities with bilateral pleural effusions and interstitial edema.  No evidence of pulmonary emboli.  Patient was given IV Solu-Medrol 125 mg, 1 L normal saline, and IV ceftriaxone and azithromycin.  The hospitalist service was consulted to admit for further evaluation and management.  Review of Systems: All systems reviewed and are negative except as documented in history of present illness above.   Past Medical History:  Diagnosis Date  . Anemia due to chronic kidney disease   . Anxiety and depression   . Chronic diastolic CHF (congestive heart failure) (HCC)   . CKD (chronic kidney disease), stage III (HCC)   . Hyperlipidemia associated with type 2 diabetes mellitus (HCC)   . Hypertension associated with diabetes (HCC)   . Insulin dependent type 2 diabetes mellitus (HCC)   . Schizoaffective disorder [facility])     Past Surgical History:  Procedure Laterality Date  . Cystoscopy with ureteral stent placement Bilateral 02/18/2016    Social History:  has no history on file for tobacco use, alcohol use, and drug use.  No Known Allergies  Family History  Problem Relation Age of Onset  . Diabetes Mother      Prior to Admission medications   Medication Sig Start Date End Date Taking? Authorizing Provider  atorvastatin (LIPITOR) 80 MG tablet Take 80 mg by mouth at bedtime. 06/17/19  Yes [provider]  carvedilol (COREG) 12.5 MG tablet Take 12.5 mg by mouth in the  morning and at bedtime. 08/24/19  Yes [provider]  Cholecalciferol (VITAMIN D3) 125 MCG (5000 UT) TABS Take 1 tablet by mouth daily. 11/18/19  Yes [provider]  divalproex (DEPAKOTE ER) 500 MG 24 hr tablet Take 1,000 mg by mouth at bedtime. 11/18/19  Yes [provider]  furosemide (LASIX) 20 MG tablet Take 40 mg by mouth daily. 11/18/19  Yes [provider]  lisinopril (ZESTRIL) 40 MG tablet Take 40 mg by mouth daily. 06/05/19  Yes [provider]  risperiDONE (RISPERDAL) 1 MG tablet Take 1 mg by mouth 2 (two) times daily. 08/24/19  Yes [provider]  TRULICITY 3 MG/0.5ML SOPN Inject 3 mg into the muscle every 7 (seven) days. 09/16/19  Yes [provider]  venlafaxine XR (EFFEXOR-XR) 75 MG 24 hr capsule Take 75 mg by mouth daily. 08/24/19  Yes [provider]  vitamin B-12 (CYANOCOBALAMIN) 250 MCG tablet Take 250 mcg by mouth daily.   Yes [provider]    Physical Exam: Vitals:   11/22/19 1826 11/22/19 1915 11/22/19 1930 11/22/19 1945  BP:  (!) 194/105 (!) 204/115 (!) 208/128  Pulse:  75 73 78  Resp:  (!) 26 (!) 21 (!) 26  Temp:      TempSrc:      SpO2:  100% 99% 99%  Weight: 111.6 kg     Height: 4\' 11"  (1.499 m)      Constitutional: Morbidly obese woman resting in bed with head elevated, NAD, calm, comfortable Eyes: PERRL, lids and conjunctivae normal ENMT: Mucous membranes are moist. Posterior pharynx clear of any exudate or lesions.  Neck: normal, supple, no masses. Respiratory: Distant breath sounds with inspiratory crackles throughout the lung fields, diminished breath sounds at the lung bases.  Normal respiratory effort. No accessory muscle use.  Cardiovascular: Regular rate and rhythm, no murmurs / rubs / gallops.  +1 bilateral lower extremity edema. 2+ pedal pulses. Abdomen: no tenderness, no masses palpated. No hepatosplenomegaly. Bowel sounds positive.  Musculoskeletal: no clubbing / cyanosis.  No joint deformity upper and lower extremities. Good ROM, no contractures. Normal muscle tone.  Skin: no rashes, lesions, ulcers. No induration Neurologic: CN 2-12 grossly intact, speech is slow but otherwise fluent without dysarthria. Sensation intact, Strength 5/5 in all 4.  Psychiatric: Normal judgment and insight. Alert and oriented x 3.  Flat affect with slow speech otherwise normal mood.   Labs on Admission: I have personally reviewed following labs and imaging studies  CBC: Recent Labs  Lab 11/22/19 1844  WBC 8.0  NEUTROABS 5.4  HGB 9.0*  HCT 29.5*  MCV 87.8  PLT 294   Basic Metabolic Panel: Recent Labs  Lab 11/22/19 1844  NA 138  K 4.5  CL 102  CO2 26  GLUCOSE 485*  BUN 25*  CREATININE 1.79*  CALCIUM 8.9   GFR: Estimated Creatinine Clearance: 36.3 mL/min (A) (by C-G formula based on SCr of 1.79 mg/dL (H)). Liver Function Tests: Recent Labs  Lab 11/22/19 1844  AST 17  ALT 11  ALKPHOS 51  BILITOT 0.8  PROT 7.3  ALBUMIN 3.2*   No results for input(s): LIPASE, AMYLASE in the last 168 hours. No results for input(s): AMMONIA in the last 168 hours. Coagulation Profile: Recent Labs  Lab 11/22/19 1844  INR 0.9   Cardiac Enzymes: No results for input(s): CKTOTAL, CKMB, CKMBINDEX, TROPONINI in the last 168 hours. BNP (last 3 results) No results for input(s): PROBNP in the last 8760 hours. HbA1C: No results for input(s): HGBA1C in the last 72 hours. CBG: No results for input(s): GLUCAP in the last 168 hours. Lipid Profile: No results for input(s): CHOL, HDL, LDLCALC, TRIG, CHOLHDL, LDLDIRECT in the last 72 hours. Thyroid Function Tests: No results for input(s): TSH, T4TOTAL, FREET4, T3FREE, THYROIDAB in the last 72 hours. Anemia Panel: No results for input(s): VITAMINB12, FOLATE, FERRITIN, TIBC, IRON, RETICCTPCT in the last 72 hours. Urine analysis: No results found for: COLORURINE, APPEARANCEUR, LABSPEC, PHURINE, GLUCOSEU, HGBUR, BILIRUBINUR, KETONESUR,  PROTEINUR, UROBILINOGEN, NITRITE, LEUKOCYTESUR  Radiological Exams on Admission: CT Angio Chest PE W and/or Wo Contrast  Result Date: 11/22/2019 CLINICAL DATA:  Syncopal episode, history of COVID-19 diagnosis EXAM: CT ANGIOGRAPHY CHEST WITH CONTRAST TECHNIQUE: Multidetector CT imaging of the chest was performed using the standard protocol during bolus administration of intravenous contrast. Multiplanar CT image reconstructions and MIPs were obtained to evaluate the vascular anatomy. CONTRAST:  60mL OMNIPAQUE IOHEXOL 350 MG/ML SOLN COMPARISON:  Chest x-ray from earlier in the same day. FINDINGS: Cardiovascular: Thoracic aorta shows no aneurysmal dilatation or dissection. Pulmonary artery shows a normal branching pattern. No definitive filling defect to suggest pulmonary embolism is seen. No significant coronary calcifications are noted. Mild cardiomegaly is noted. Mediastinum/Nodes: Thoracic inlet is within normal limits. Scattered small hilar and mediastinal lymph nodes are noted likely reactive in nature. The esophagus as visualized is within normal limits. Lungs/Pleura: Lungs are well aerated bilaterally. Some interstitial edema is noted as well as patchy airspace opacities bilaterally consistent with the given clinical history of COVID-19 positivity. Bilateral pleural effusions are noted. Mild lower lobe atelectatic changes are noted. Upper Abdomen: Visualized upper abdomen is unremarkable. Musculoskeletal: Degenerative changes of the thoracic spine are noted. Review of the MIP images confirms the above findings. IMPRESSION: Patchy airspace opacities consistent with the given clinical history of COVID-19 positivity. Bilateral pleural effusions as well as interstitial edema consistent with superimposed CHF. No evidence of pulmonary emboli. Electronically Signed   By: Alcide CleverMark  Lukens M.D.   On: 11/22/2019 20:24   DG Chest Port 1 View  Result Date: 11/22/2019 CLINICAL DATA:  Questionable sepsis - evaluate for  abnormality Syncope.  Recent COVID. EXAM: PORTABLE CHEST 1 VIEW COMPARISON:  None. FINDINGS: Lung volumes are low. Mild cardiomegaly. Patchy heterogeneous bilateral airspace opacities in a mid-lower lung zone predominant distribution. There also hazy opacities at the lung bases that may represent small effusions. No pneumothorax. No acute osseous abnormalities are seen. IMPRESSION: 1. Patchy bilateral airspace disease typical of COVID-19 pneumonia. 2. Additional hazy opacity at the lung bases may represent small effusions. Mild cardiomegaly. Electronically Signed   By: Narda RutherfordMelanie  Sanford M.D.   On: 11/22/2019 18:41    EKG: Personally reviewed. Normal sinus rhythm without acute ischemic changes.  No prior for comparison.  Assessment/Plan Principal Problem:   Acute hypoxemic respiratory failure due to COVID-19 [facility](HCC) Active Problems:   Diabetes mellitus (HCC)   Hypertension associated with diabetes (HCC)   Stage 3b chronic kidney disease (HCC)   Hyperlipidemia associated with type 2 diabetes mellitus (HCC)   Anemia of chronic disease   Acute on chronic  diastolic CHF (congestive heart failure) (HCC)   Anxiety and depression   Schizoaffective disorder (HCC)  Patricia Bates is a 64 y.o. 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  Acute hypoxemic respiratory failure due to COVID-19 pneumonitis: Initially SARS-CoV-2 positive 10/06/2019 while admitted at [facility].  X-ray report at that time showed developing right-sided pulmonary infiltrate.  Patient required 2 L of home O2 via [place] at that time.  She was treated only with a few days of Decadron and discharged to home on room air.  Patient returns with hypoxia, SPO2 82% on room air.  CTA chest shows patchy airspace opacities  throughout both lungs likely due to progressive lung call COVID-19 inflammation/pneumonitis. -Initial Covid positive 10/06/2019-no longer requires isolation -Start IV Solu-Medrol 60 mg twice daily -Continue incentive spirometer, flutter valve, Combivent -Antitussives, vitamin C, zinc -Mobilize with PT/OT  Acute on chronic chronic diastolic CHF exacerbation: EF 62%, G1 DD by TTE 10/06/2019.  Patient has bilateral pleural effusions on imaging with peripheral edema on exam.  BNP elevated to 269.3.  Takes Lasix 40 mg daily as an outpatient. -Start IV Lasix 40 mg twice daily -Strict I/O's and daily weights -Monitor renal function and electrolytes  Syncope: Patient reports transient syncopal episode while at rest prior to admission.  Suspect this was related to hypoxia from the above issues.  EKG shows sinus rhythm.  Will continue to monitor on telemetry.  Insulin-dependent type 2 diabetes with hyperglycemia: Serum glucose 485 on admission without evidence of DKA or HHS.  At risk for worsening glycemic control while requiring steroids. -Place on Lantus 50 units nightly -Start resistant sliding scale insulin with HS coverage -Check A1c  Hypertension: BP up to 200/110s on arrival.  Give IV labetalol 10 mg once now.  Start IV Lasix diuresis as above.  Resume home lisinopril 40 mg daily and Coreg 12.5 mg twice daily.  CKD stage IIIb: Review of care everywhere records shows renal function is near her usual baseline, may be slightly worsened in setting of acute illness and CHF.  Continue diuresis as above and monitor renal function.  Hyperlipidemia: Continue atorvastatin.  Anemia of chronic disease: Hemoglobin 9.0 on admission compared to 10.1 on 10/17/2019.  No obvious bleeding.  Continue to monitor.  Anxiety/depression/schizoaffective disorder: Continue home Depakote, Effexor, Risperdal.  DVT prophylaxis: Lovenox Code Status: Full code, confirmed with patient Family Communication: Discussed  with patient's son at bedside. Disposition Plan: From home, dispo pending improvement in respiratory status, adequate diuresis, and PT/OT eval. Consults called: None Admission status:  Status is: Inpatient  Remains inpatient appropriate because:IV treatments appropriate due to intensity of illness or inability to take PO and Inpatient level of care appropriate due to severity of illness   Dispo: The patient is from: Home              Anticipated d/c is to: Home versus SNF pending PT/OT eval              Anticipated d/c date is: 3 days              Patient currently is not medically stable to d/c.  Darreld Mclean MD [facility]  If 7PM-7AM, please contact night-coverage [email or web address]  11/22/2019, 10:13 PM

## 2019-11-22 NOTE — ED Triage Notes (Signed)
Per EMS, pt had syncopal episode lasting 10 minutes. Family reports "inefficient respirations" during the episode. Pt reports recent covid dx "2-3 weeks ago"

## 2019-11-23 ENCOUNTER — Other Ambulatory Visit: Payer: Self-pay

## 2019-11-23 ENCOUNTER — Encounter: Payer: Self-pay | Admitting: Internal Medicine

## 2019-11-23 ENCOUNTER — Inpatient Hospital Stay (HOSPITAL_COMMUNITY)
Admit: 2019-11-23 | Discharge: 2019-11-23 | Disposition: A | Discharge status: Home / Self Care | Payer: Medicaid Other | Attending: Internal Medicine | Admitting: Internal Medicine

## 2019-11-23 DIAGNOSIS — J9601 Acute respiratory failure with hypoxia: Secondary | ICD-10-CM | POA: Diagnosis not present

## 2019-11-23 DIAGNOSIS — U071 COVID-19: Secondary | ICD-10-CM | POA: Diagnosis not present

## 2019-11-23 DIAGNOSIS — I5031 Acute diastolic (congestive) heart failure: Secondary | ICD-10-CM | POA: Diagnosis not present

## 2019-11-23 LAB — BASIC METABOLIC PANEL
Anion gap: 13 (ref 5–15)
Anion gap: 11 (ref 5–15)
BUN: 24 mg/dL — ABNORMAL HIGH (ref 8–23)
BUN: 27 mg/dL — ABNORMAL HIGH (ref 8–23)
CO2: 23 mmol/L (ref 22–32)
CO2: 24 mmol/L (ref 22–32)
Calcium: 8.5 mg/dL — ABNORMAL LOW (ref 8.9–10.3)
Calcium: 8.8 mg/dL — ABNORMAL LOW (ref 8.9–10.3)
Chloride: 102 mmol/L (ref 98–111)
Chloride: 99 mmol/L (ref 98–111)
Creatinine, Ser: 1.54 mg/dL — ABNORMAL HIGH (ref 0.44–1.00)
Creatinine, Ser: 1.7 mg/dL — ABNORMAL HIGH (ref 0.44–1.00)
GFR, Estimated: 38 mL/min — ABNORMAL LOW (ref >60)
GFR, Estimated: 34 mL/min — ABNORMAL LOW (ref >60)
Glucose, Bld: 459 mg/dL — ABNORMAL HIGH (ref 70–99)
Glucose, Bld: 479 mg/dL — ABNORMAL HIGH (ref 70–99)
Potassium: 4.5 mmol/L (ref 3.5–5.1)
Potassium: 4.4 mmol/L (ref 3.5–5.1)
Sodium: 138 mmol/L (ref 135–145)
Sodium: 134 mmol/L — ABNORMAL LOW (ref 135–145)

## 2019-11-23 LAB — MAGNESIUM: Magnesium: 2.2 mg/dL (ref 1.7–2.4)

## 2019-11-23 LAB — CBG MONITORING, ED
Glucose-Capillary: 474 mg/dL — ABNORMAL HIGH (ref 70–99)
Glucose-Capillary: 421 mg/dL — ABNORMAL HIGH (ref 70–99)
Glucose-Capillary: 471 mg/dL — ABNORMAL HIGH (ref 70–99)
Glucose-Capillary: 453 mg/dL — ABNORMAL HIGH (ref 70–99)

## 2019-11-23 LAB — ECHOCARDIOGRAM COMPLETE
AR max vel: 1.93 cm2
AV Area VTI: 2.32 cm2
AV Area mean vel: 1.76 cm2
AV Mean grad: 5 mmHg
AV Peak grad: 8.4 mmHg
Ao pk vel: 1.45 m/s
Area-P 1/2: 3 cm2
Height: 59 in
S' Lateral: 2.58 cm
Weight: 3936 oz

## 2019-11-23 LAB — HIV ANTIBODY (ROUTINE TESTING W REFLEX): HIV Screen 4th Generation wRfx: NONREACTIVE

## 2019-11-23 LAB — CBC
HCT: 33.7 % — ABNORMAL LOW (ref 36.0–46.0)
Hemoglobin: 10.1 g/dL — ABNORMAL LOW (ref 12.0–15.0)
MCH: 26.7 pg (ref 26.0–34.0)
MCHC: 30 g/dL (ref 30.0–36.0)
MCV: 89.2 fL (ref 80.0–100.0)
Platelets: 289 10*3/uL (ref 150–400)
RBC: 3.78 MIL/uL — ABNORMAL LOW (ref 3.87–5.11)
RDW: 16 % — ABNORMAL HIGH (ref 11.5–15.5)
WBC: 6.7 10*3/uL (ref 4.0–10.5)
nRBC: 0 % (ref 0.0–0.2)

## 2019-11-23 LAB — URINALYSIS, COMPLETE (UACMP) WITH MICROSCOPIC
Bacteria, UA: NONE SEEN
Bilirubin Urine: NEGATIVE
Glucose, UA: >=500 mg/dL — AB
Ketones, ur: NEGATIVE mg/dL
Leukocytes,Ua: NEGATIVE
Nitrite: NEGATIVE
Protein, ur: 100 mg/dL — AB
Specific Gravity, Urine: 1.01 (ref 1.005–1.030)
pH: 5 (ref 5.0–8.0)

## 2019-11-23 LAB — GLUCOSE, CAPILLARY
Glucose-Capillary: 322 mg/dL — ABNORMAL HIGH (ref 70–99)
Glucose-Capillary: 199 mg/dL — ABNORMAL HIGH (ref 70–99)

## 2019-11-23 LAB — C-REACTIVE PROTEIN: CRP: <0.5 mg/dL (ref <1.0)

## 2019-11-23 LAB — HEMOGLOBIN A1C
Hgb A1c MFr Bld: 8.5 % — ABNORMAL HIGH (ref 4.8–5.6)
Mean Plasma Glucose: 197.25 mg/dL

## 2019-11-23 MED ORDER — FUROSEMIDE 10 MG/ML IJ SOLN
60.0000 mg | Freq: Two times a day (BID) | INTRAMUSCULAR | Status: DC
  Administered 2019-11-23 – 2019-11-24 (×3): 60 mg via INTRAVENOUS
  Filled 2019-11-23 (×3): qty 8

## 2019-11-23 MED ORDER — HYDRALAZINE HCL 20 MG/ML IJ SOLN
10.0000 mg | Freq: Once | INTRAMUSCULAR | Status: AC
  Administered 2019-11-23: 10 mg via INTRAVENOUS
  Filled 2019-11-23: qty 1

## 2019-11-23 MED ORDER — LABETALOL HCL 5 MG/ML IV SOLN
10.0000 mg | INTRAVENOUS | Status: DC | PRN
  Administered 2019-11-23 – 2019-11-27 (×4): 10 mg via INTRAVENOUS
  Filled 2019-11-23 (×4): qty 4

## 2019-11-23 MED ORDER — INSULIN GLARGINE 100 UNIT/ML ~~LOC~~ SOLN
70.0000 [IU] | Freq: Every day | SUBCUTANEOUS | Status: DC
Start: 2019-11-24 — End: 2019-11-27
  Administered 2019-11-24 – 2019-11-27 (×4): 70 [IU] via SUBCUTANEOUS
  Filled 2019-11-23 (×4): qty 0.7

## 2019-11-23 MED ORDER — INSULIN ASPART 100 UNIT/ML ~~LOC~~ SOLN
10.0000 [IU] | Freq: Three times a day (TID) | SUBCUTANEOUS | Status: DC
  Administered 2019-11-23 – 2019-11-24 (×5): 10 [IU] via SUBCUTANEOUS
  Filled 2019-11-23 (×6): qty 1

## 2019-11-23 MED ORDER — METHYLPREDNISOLONE SODIUM SUCC 40 MG IJ SOLR
40.0000 mg | Freq: Two times a day (BID) | INTRAMUSCULAR | Status: DC
  Administered 2019-11-23 – 2019-11-24 (×3): 40 mg via INTRAVENOUS
  Filled 2019-11-23 (×3): qty 1

## 2019-11-23 MED ORDER — ENOXAPARIN SODIUM 60 MG/0.6ML ~~LOC~~ SOLN
0.5000 mg/kg | SUBCUTANEOUS | Status: DC
  Administered 2019-11-23 – 2019-11-26 (×4): 55 mg via SUBCUTANEOUS
  Filled 2019-11-23 (×4): qty 0.6

## 2019-11-23 MED ORDER — INSULIN GLARGINE 100 UNIT/ML ~~LOC~~ SOLN
20.0000 [IU] | Freq: Once | SUBCUTANEOUS | Status: AC
  Administered 2019-11-23: 20 [IU] via SUBCUTANEOUS
  Filled 2019-11-23: qty 0.2

## 2019-11-23 NOTE — Progress Notes (Signed)
OT Cancellation Note  Patient Details Name: Patricia Bates MRN: [id number] DOB: 05-04-1955   Cancelled Treatment:    Reason Eval/Treat Not Completed: Medical issues which prohibited therapy. OT order received and chart reviewed. Pt held this morning secondary to most recent blood glucose of 471. Per OT guidelines, pt is outside of therapeutic range for participation with therapeutic interventions. OT will re-attempt when pt is able to participate.   Jackquline Denmark, MS, OTR/L , CBIS ascom [phone]  11/23/19, 8:59 AM   11/23/2019, 8:57 AM

## 2019-11-23 NOTE — Evaluation (Signed)
Physical Therapy Evaluation Patient Details Name: Patricia Bates MRN: [id number] DOB: 11/09/1955 Today's Date: 11/23/2019   History of Present Illness  64 y.o. female with medical history significant for chronic diastolic CHF (EF 23%, G1 DD by TTE 10/06/2019), T2DM, HTN, HLD, CKD stage III, anxiety/depression, schizoaffective disorder, and morbid obesity who presents to the ED for evaluation of shortness of breath and syncopal event.  She was hospitalized for AKI and Covid at a different hospital in September  Clinical Impression  Pt did very well with PT exam and showed good confidence, safety and effort with mobility, standing and ~100 ft of ambulation in the room.  Pt on 3L O2 t/o the effort with sats remaining in the 90s (slowly dropping during the effort) HR slowly increased to ~120 as well.  Pt with no LOBs or hesitancy and though she is not back to her baseline of community ambulation w/o AD she is safe to return home with HHPT when medically cleared for d/c.    Follow Up Recommendations Home health PT;Supervision - Intermittent    Equipment Recommendations  None recommended by PT (did discuss SPC as a possible transition back to no AD)    Recommendations for Other Services       Precautions / Restrictions Precautions Precautions:  (minimal fall risk) Restrictions Weight Bearing Restrictions: No      Mobility  Bed Mobility Overal bed mobility: Needs Assistance Bed Mobility: Sidelying to Sit;Supine to Sit   Sidelying to sit: Min guard Supine to sit: Min assist     General bed mobility comments: Pt needed only very light HHA to pull up from getting to sitting, did need assist getting LEs back into bed (likely more related to height of ED bed and true inability)    Transfers Overall transfer level: Modified independent Equipment used: Rolling walker (2 wheeled)             General transfer comment: Pt able to rise and maintain balance initially w/o walker, no assist  needed  Ambulation/Gait Ambulation/Gait assistance: Supervision Gait Distance (Feet): 100 Feet Assistive device: Rolling walker (2 wheeled) (O2 3L)       General Gait Details: Pt showed good safety and confidence with multiple in-room loops.  Good awareness with walker during turns/tight spaces, good overall awareness and safety and relatively consistent vitals on 3L O2.  Sats stayed in the 90s with slow drop from high 90s, HR stayed 80s much of the time but near the end were starting spike to 110s and even >120 a few times.  Pt with mild fatigue, but ultimately felt good and did relatively well.  Stairs            Wheelchair Mobility    Modified Rankin (Stroke Patients Only)       Balance Overall balance assessment: Modified Independent                                           Pertinent Vitals/Pain Pain Assessment: No/denies pain    Home Living Family/patient expects to be discharged to:: Private residence Living Arrangements: Children;Other relatives Available Help at Discharge: Family;Available 24 hours/day   Home Access: Stairs to enter Entrance Stairs-Rails:  (yes, one) Entrance Stairs-Number of Steps: 3 Home Layout: Two level Home Equipment: Walker - 2 wheels      Prior Function Level of Independence: Independent (pt only started using walker in  the last week 2/2 weakness.)               Hand Dominance        Extremity/Trunk Assessment   Upper Extremity Assessment Upper Extremity Assessment: Overall WFL for tasks assessed;Generalized weakness    Lower Extremity Assessment Lower Extremity Assessment: Overall WFL for tasks assessed;Generalized weakness       Communication   Communication: No difficulties  Cognition Arousal/Alertness: Awake/alert Behavior During Therapy: WFL for tasks assessed/performed Overall Cognitive Status: Within Functional Limits for tasks assessed                                  General Comments: Pt very pleasant and eager to work with PT      General Comments      Exercises     Assessment/Plan    PT Assessment Patient needs continued PT services  PT Problem List Decreased strength;Decreased activity tolerance;Decreased balance;Decreased safety awareness;Decreased mobility;Decreased knowledge of use of DME;Cardiopulmonary status limiting activity       PT Treatment Interventions DME instruction;Gait training;Stair training;Functional mobility training;Therapeutic activities;Therapeutic exercise;Balance training;Patient/family education    PT Goals (Current goals can be found in the Care Plan section)  Acute Rehab PT Goals Patient Stated Goal: go home PT Goal Formulation: With patient Time For Goal Achievement: 12/07/19 Potential to Achieve Goals: Fair    Frequency Min 2X/week   Barriers to discharge        Co-evaluation               AM-PAC PT "6 Clicks" Mobility  Outcome Measure Help needed turning from your back to your side while in a flat bed without using bedrails?: A Little Help needed moving from lying on your back to sitting on the side of a flat bed without using bedrails?: A Little Help needed moving to and from a bed to a chair (including a wheelchair)?: A Little Help needed standing up from a chair using your arms (e.g., wheelchair or bedside chair)?: None Help needed to walk in hospital room?: None Help needed climbing 3-5 steps with a railing? : A Little 6 Click Score: 20    End of Session Equipment Utilized During Treatment: Gait belt;Oxygen (3L) Activity Tolerance: Patient tolerated treatment well   Nurse Communication: Mobility status PT Visit Diagnosis: Muscle weakness (generalized) (M62.81);Difficulty in walking, not elsewhere classified (R26.2)    Time: 7062-3762 PT Time Calculation (min) (ACUTE ONLY): 30 min   Charges:   PT Evaluation $PT Eval Low Complexity: 1 Low PT Treatments $Gait Training: 8-22  mins        Malachi Pro, DPT 11/23/2019, 12:20 PM

## 2019-11-23 NOTE — ED Notes (Signed)
Attempted to call report

## 2019-11-23 NOTE — ED Notes (Signed)
PT at bedside.

## 2019-11-23 NOTE — ED Notes (Signed)
Pt resting comfortably at this time. Pt hypertensive and given BP PO meds. FSBS 471. Pt is A&Ox4. No distress noted. Call bell in reach. Pt denies any further needs at this time.

## 2019-11-23 NOTE — Progress Notes (Signed)
PROGRESS NOTE    Patricia Bates  JWJ:[id number] DOB: [birth year] DOA: 11/22/2019 PCP: Jimmye Norman, NP    Brief Narrative:  64 y.o. female with medical history significant for chronic diastolic CHF (EF 62%, G1 DD by TTE 10/06/2019), T2DM, HTN, HLD, CKD stage III, anxiety/depression, schizoaffective disorder, and morbid obesity who presents to the ED for evaluation of shortness of breath and syncopal event.  Patient was recently hospitalized at [facility] from 10/05/2019-10/18/2019 for AKI on CKD stage III.  She was found to be COVID-19 positive on 9/16.  Per care everywhere documentation, she was initially asymptomatic however did develop supplemental O2 requirement of 2 L via [place].  She was treated short-term with Decadron and was weaned off oxygen prior to discharge.  Presents with evidence of hypoxic respiratory failure and fluid overload. As of this morning 11/23/2019 patient states her symptoms are improved. She was started on intravenous Lasix and intravenous Solu-Medrol.   Assessment & Plan:   Principal Problem:   Acute hypoxemic respiratory failure due to COVID-19 [facility]) Active Problems:   Diabetes mellitus (HCC)   Hypertension associated with diabetes (HCC)   Stage 3b chronic kidney disease (HCC)   Hyperlipidemia associated with type 2 diabetes mellitus (HCC)   Anemia of chronic disease   Acute on chronic diastolic CHF (congestive heart failure) (HCC)   Anxiety and depression   Schizoaffective disorder (HCC)  Acute hypoxemic respiratory failure due to COVID-19 pneumonitis: Initially SARS-CoV-2 positive 10/06/2019 while admitted at [facility].   X-ray report at that time showed developing right-sided pulmonary infiltrate.   Patient required 2 L of home O2 via [place] at that time.   She was treated only with a few days of Decadron and discharged to home on room air.   Patient returns with hypoxia, SPO2 82% on room air.   CTA chest shows patchy airspace  opacities throughout both lungs likely due to progressive lung call COVID-19 inflammation/pneumonitis. Plan: Initial Covid positive 10/06/2019-no longer requires isolation Continue IV Solu-Medrol, dose decreased to 40 mg twice daily -Continue incentive spirometer, flutter valve, Combivent -Antitussives, vitamin C, zinc -Mobilize with PT/OT  Acute on chronic chronic diastolic CHF exacerbation: EF 13%, G1 DD by TTE 10/06/2019.   Patient has bilateral pleural effusions on imaging with peripheral edema on exam. - BNP elevated to 269.3.  Takes Lasix 40 mg daily as an outpatient. -Repeat echo grade 2 diastolic dysfunction with normal EF Plan: Continue Lasix, dose increased to 60 mg IV twice daily Continue strict I's and O's and daily weights Target net negative balance 1 to 1.5 L daily Monitor renal function electrolytes  Syncope: Patient reports transient syncopal episode while at rest prior to admission.   Suspect this was related to hypoxia from the above issues.   EKG shows sinus rhythm.   Will continue to monitor on telemetry.  Insulin-dependent type 2 diabetes with hyperglycemia: Serum glucose 485 on admission without evidence of DKA or HHS.   At risk for worsening glycemic control while requiring steroids. Diabetic coordinator following Plan: Lantus 70 units daily NovoLog 10 units 3 times daily with meals Resistant sliding scale coverage Carb modified diet  Hypertension: BP up to 200/110s on arrival.   Suspect patient's blood pressure suboptimally controlled as outpatient Steroids might also be contributing to hypertension Plan: Continue Coreg and amlodipine for home dose As needed labetalol   CKD stage IIIb: Review of care everywhere records shows renal function is near her usual baseline, may be slightly worsened in setting  of acute illness and CHF.   Continue diuresis as above and monitor renal function.  Hyperlipidemia: Continue atorvastatin.  Anemia of  chronic disease: Hemoglobin 9.0 on admission compared to 10.1 on 10/17/2019.   No obvious bleeding.  Continue to monitor.  Anxiety/depression/schizoaffective disorder: Continue home Depakote, Effexor, Risperdal.  Obesity BMI 49.69 Complicates overall care and prognosis   DVT prophylaxis: Lovenox Code Status: Full Family Communication: None today  disposition Plan: Status is: Inpatient  Remains inpatient appropriate because:Inpatient level of care appropriate due to severity of illness   Dispo: The patient is from: Home              Anticipated d/c is to: Home              Anticipated d/c date is: 2 days              Patient currently is not medically stable to d/c.         Consultants:   None  Procedures:   None  Antimicrobials:   None    Subjective: Seen and examined. Reports improvement in shortness of breath. No pain complaints.   Objective: Vitals:   11/23/19 1130 11/23/19 1330 11/23/19 1400 11/23/19 1500  BP: (!) 193/71 (!) 185/95 (!) 172/69 (!) 165/77  Pulse: 80 77 82 78  Resp: (!) 22 16 17  (!) 23  Temp:      TempSrc:      SpO2: 94% 97% 97% 96%  Weight:      Height:        Intake/Output Summary (Last 24 hours) at 11/23/2019 1537 Last data filed at 11/23/2019 0320 Gross per 24 hour  Intake --  Output 1200 ml  Net -1200 ml   Filed Weights   11/22/19 1826  Weight: 111.6 kg    Examination:  General exam: No acute distress Respiratory system: Poor respiratory effort. Bibasilar crackles. Normal work of breathing. 2 L Cardiovascular system: S1-S2 heard, no murmurs, 3+ pedal edema bilaterally  gastrointestinal system: Nontender, nondistended, normal bowel sounds, obese Central nervous system: Alert and oriented. No focal neurological deficits. Extremities: Symmetric 5 x 5 power. Skin: No rashes, lesions or ulcers Psychiatry: Judgement and insight appear normal. Mood & affect appropriate.     Data Reviewed: I have personally reviewed  following labs and imaging studies  CBC: Recent Labs  Lab 11/22/19 1844 11/23/19 0500  WBC 8.0 6.7  NEUTROABS 5.4  --   HGB 9.0* 10.1*  HCT 29.5* 33.7*  MCV 87.8 89.2  PLT 294 289   Basic Metabolic Panel: Recent Labs  Lab 11/22/19 1844 11/23/19 0500 11/23/19 1013  NA 138 138 134*  K 4.5 4.5 4.4  CL 102 102 99  CO2 26 23 24   GLUCOSE 485* 459* 479*  BUN 25* 24* 27*  CREATININE 1.79* 1.54* 1.70*  CALCIUM 8.9 8.5* 8.8*  MG  --  2.2  --    GFR: Estimated Creatinine Clearance: 38.2 mL/min (A) (by C-G formula based on SCr of 1.7 mg/dL (H)). Liver Function Tests: Recent Labs  Lab 11/22/19 1844  AST 17  ALT 11  ALKPHOS 51  BILITOT 0.8  PROT 7.3  ALBUMIN 3.2*   No results for input(s): LIPASE, AMYLASE in the last 168 hours. No results for input(s): AMMONIA in the last 168 hours. Coagulation Profile: Recent Labs  Lab 11/22/19 1844  INR 0.9   Cardiac Enzymes: No results for input(s): CKTOTAL, CKMB, CKMBINDEX, TROPONINI in the last 168 hours. BNP (last 3 results) No  results for input(s): PROBNP in the last 8760 hours. HbA1C: Recent Labs    11/23/19 0500  HGBA1C 8.5*   CBG: Recent Labs  Lab 11/23/19 0121 11/23/19 0442 11/23/19 0738 11/23/19 1230  GLUCAP 474* 421* 471* 453*   Lipid Profile: Recent Labs    11/22/19 2200  TRIG 161*   Thyroid Function Tests: No results for input(s): TSH, T4TOTAL, FREET4, T3FREE, THYROIDAB in the last 72 hours. Anemia Panel: Recent Labs    11/22/19 2145  FERRITIN 86   Sepsis Labs: Recent Labs  Lab 11/22/19 1844  PROCALCITON <0.10  LATICACIDVEN 1.4    Recent Results (from the past 240 hour(s))  Blood Culture (routine x 2)     Status: None (Preliminary result)   Collection Time: 11/22/19  6:44 PM   Specimen: BLOOD  Result Value Ref Range Status   Specimen Description BLOOD RIGHT ANTECUBITAL  Final   Special Requests   Final    BOTTLES DRAWN AEROBIC AND ANAEROBIC Blood Culture adequate volume   Culture    Final    NO GROWTH < 12 HOURS Performed at [facility], [address], [place], [place] [address]    Report Status PENDING  Incomplete  Blood Culture (routine x 2)     Status: None (Preliminary result)   Collection Time: 11/22/19  7:00 PM   Specimen: BLOOD  Result Value Ref Range Status   Specimen Description BLOOD LEFT ANTECUBITAL  Final   Special Requests   Final    BOTTLES DRAWN AEROBIC AND ANAEROBIC Blood Culture adequate volume   Culture   Final    NO GROWTH < 12 HOURS Performed at [facility], [address] [place], [place] [address]    Report Status PENDING  Incomplete         Radiology Studies: CT Angio Chest PE W and/or Wo Contrast  Result Date: 11/22/2019 CLINICAL DATA:  Syncopal episode, history of COVID-19 diagnosis EXAM: CT ANGIOGRAPHY CHEST WITH CONTRAST TECHNIQUE: Multidetector CT imaging of the chest was performed using the standard protocol during bolus administration of intravenous contrast. Multiplanar CT image reconstructions and MIPs were obtained to evaluate the vascular anatomy. CONTRAST:  47mL OMNIPAQUE IOHEXOL 350 MG/ML SOLN COMPARISON:  Chest x-ray from earlier in the same day. FINDINGS: Cardiovascular: Thoracic aorta shows no aneurysmal dilatation or dissection. Pulmonary artery shows a normal branching pattern. No definitive filling defect to suggest pulmonary embolism is seen. No significant coronary calcifications are noted. Mild cardiomegaly is noted. Mediastinum/Nodes: Thoracic inlet is within normal limits. Scattered small hilar and mediastinal lymph nodes are noted likely reactive in nature. The esophagus as visualized is within normal limits. Lungs/Pleura: Lungs are well aerated bilaterally. Some interstitial edema is noted as well as patchy airspace opacities bilaterally consistent with the given clinical history of COVID-19 positivity. Bilateral pleural effusions are noted. Mild lower lobe atelectatic changes are noted. Upper  Abdomen: Visualized upper abdomen is unremarkable. Musculoskeletal: Degenerative changes of the thoracic spine are noted. Review of the MIP images confirms the above findings. IMPRESSION: Patchy airspace opacities consistent with the given clinical history of COVID-19 positivity. Bilateral pleural effusions as well as interstitial edema consistent with superimposed CHF. No evidence of pulmonary emboli. Electronically Signed   By: Alcide Clever M.D.   On: 11/22/2019 20:24   DG Chest Port 1 View  Result Date: 11/22/2019 CLINICAL DATA:  Questionable sepsis - evaluate for abnormality Syncope.  Recent COVID. EXAM: PORTABLE CHEST 1 VIEW COMPARISON:  None. FINDINGS: Lung volumes are low. Mild  cardiomegaly. Patchy heterogeneous bilateral airspace opacities in a mid-lower lung zone predominant distribution. There also hazy opacities at the lung bases that may represent small effusions. No pneumothorax. No acute osseous abnormalities are seen. IMPRESSION: 1. Patchy bilateral airspace disease typical of COVID-19 pneumonia. 2. Additional hazy opacity at the lung bases may represent small effusions. Mild cardiomegaly. Electronically Signed   By: Narda Rutherford M.D.   On: 11/22/2019 18:41   ECHOCARDIOGRAM COMPLETE  Result Date: 11/23/2019    ECHOCARDIOGRAM REPORT   Patient Name:   Patricia Bates Date of Exam: 11/23/2019 Medical Rec #:  [id number]    Height:       59.0 in Accession #:    [id number]   Weight:       246.0 lb Date of Birth:  [birth year]    BSA:          2.014 m Patient Age:    64 years     BP:           184/79 mmHg Patient Gender: F            HR:           79 bpm. Exam Location:  [facility] Procedure: 2D Echo, Cardiac Doppler and Color Doppler Indications:     CHF- acute diastolic 428.31  History:         Patient has no prior history of Echocardiogram examinations.                  Risk Factors:Hypertension, Diabetes and Dyslipidemia.  Sonographer:     Cristela Blue RDCS (AE) Referring Phys:  [id number] Tresa Moore  Diagnosing Phys: Debbe Odea MD  Sonographer Comments: Suboptimal apical window. IMPRESSIONS  1. Left ventricular ejection fraction, by estimation, is 60 to 65%. The left ventricle has normal function. The left ventricle has no regional wall motion abnormalities. There is mild left ventricular hypertrophy. Left ventricular diastolic parameters are consistent with Grade II diastolic dysfunction (pseudonormalization).  2. Right ventricular systolic function is normal. The right ventricular size is normal. There is normal pulmonary artery systolic pressure.  3. Left atrial size was mildly dilated.  4. The mitral valve is normal in structure. No evidence of mitral valve regurgitation.  5. The aortic valve is grossly normal. Aortic valve regurgitation is not visualized.  6. The inferior vena cava is normal in size with <50% respiratory variability, suggesting right atrial pressure of 8 mmHg. FINDINGS  Left Ventricle: Left ventricular ejection fraction, by estimation, is 60 to 65%. The left ventricle has normal function. The left ventricle has no regional wall motion abnormalities. The left ventricular internal cavity size was normal in size. There is  mild left ventricular hypertrophy. Left ventricular diastolic parameters are consistent with Grade II diastolic dysfunction (pseudonormalization). Right Ventricle: The right ventricular size is normal. No increase in right ventricular wall thickness. Right ventricular systolic function is normal. There is normal pulmonary artery systolic pressure. The tricuspid regurgitant velocity is 1.68 m/s, and  with an assumed right atrial pressure of 8 mmHg, the estimated right ventricular systolic pressure is 19.3 mmHg. Left Atrium: Left atrial size was mildly dilated. Right Atrium: Right atrial size was normal in size. Pericardium: There is no evidence of pericardial effusion. Mitral Valve: The mitral valve is normal in structure. No evidence of mitral valve regurgitation.  Tricuspid Valve: The tricuspid valve is normal in structure. Tricuspid valve regurgitation is not demonstrated. Aortic Valve: The aortic valve is grossly normal. Aortic valve regurgitation is not visualized.  Aortic valve mean gradient measures 5.0 mmHg. Aortic valve peak gradient measures 8.4 mmHg. Aortic valve area, by VTI measures 2.32 cm. Pulmonic Valve: The pulmonic valve was not well visualized. Pulmonic valve regurgitation is not visualized. Aorta: The aortic root is normal in size and structure. Venous: The inferior vena cava is normal in size with less than 50% respiratory variability, suggesting right atrial pressure of 8 mmHg. IAS/Shunts: No atrial level shunt detected by color flow Doppler.  LEFT VENTRICLE PLAX 2D LVIDd:         4.61 cm  Diastology LVIDs:         2.58 cm  LV e' medial:    4.79 cm/s LV PW:         1.14 cm  LV E/e' medial:  19.7 LV IVS:        1.46 cm  LV e' lateral:   4.79 cm/s LVOT diam:     2.00 cm  LV E/e' lateral: 19.7 LV SV:         71 LV SV Index:   35 LVOT Area:     3.14 cm  RIGHT VENTRICLE RV Basal diam:  3.26 cm RV S prime:     11.50 cm/s LEFT ATRIUM             Index       RIGHT ATRIUM           Index LA diam:        4.30 cm 2.14 cm/m  RA Area:     20.30 cm LA Vol (A2C):   92.4 ml 45.88 ml/m RA Volume:   60.90 ml  30.24 ml/m LA Vol (A4C):   62.6 ml 31.08 ml/m LA Biplane Vol: 75.7 ml 37.59 ml/m  AORTIC VALVE                    PULMONIC VALVE AV Area (Vmax):    1.93 cm     PV Vmax:        0.94 m/s AV Area (Vmean):   1.76 cm     PV Peak grad:   3.5 mmHg AV Area (VTI):     2.32 cm     RVOT Peak grad: 6 mmHg AV Vmax:           144.50 cm/s AV Vmean:          101.350 cm/s AV VTI:            0.306 m AV Peak Grad:      8.4 mmHg AV Mean Grad:      5.0 mmHg LVOT Vmax:         88.70 cm/s LVOT Vmean:        56.800 cm/s LVOT VTI:          0.226 m LVOT/AV VTI ratio: 0.74  AORTA Ao Root diam: 2.50 cm MITRAL VALVE                TRICUSPID VALVE MV Area (PHT): 3.00 cm     TR Peak grad:    11.3 mmHg MV Decel Time: 253 msec     TR Vmax:        168.00 cm/s MV E velocity: 94.60 cm/s MV A velocity: 115.00 cm/s  SHUNTS MV E/A ratio:  0.82         Systemic VTI:  0.23 m  Systemic Diam: 2.00 cm Debbe Odea MD Electronically signed by Debbe Odea MD Signature Date/Time: 11/23/2019/12:48:05 PM    Final         Scheduled Meds: . vitamin C  1,000 mg Oral Daily  . atorvastatin  80 mg Oral QHS  . carvedilol  12.5 mg Oral BID WC  . divalproex  1,000 mg Oral QHS  . enoxaparin (LOVENOX) injection  0.5 mg/kg Subcutaneous Q24H  . furosemide  60 mg Intravenous Q12H  . insulin aspart  0-20 Units Subcutaneous TID WC  . insulin aspart  0-5 Units Subcutaneous QHS  . insulin aspart  10 Units Subcutaneous TID WC  . [START ON 11/24/2019] insulin glargine  70 Units Subcutaneous Daily  . lisinopril  40 mg Oral Daily  . methylPREDNISolone (SOLU-MEDROL) injection  40 mg Intravenous Q12H  . risperiDONE  1 mg Oral BID  . venlafaxine XR  75 mg Oral Daily  . zinc sulfate  220 mg Oral Daily   Continuous Infusions:   LOS: 1 day    Time spent: 25 minutes    Tresa Moore, MD [facility] Pager [phone]  If 7PM-7AM, please contact night-coverage 11/23/2019, 3:37 PM

## 2019-11-23 NOTE — ED Notes (Signed)
Emptied 1200 mL of urine from purewick suction container. Urine appearance clear and pale yellow

## 2019-11-23 NOTE — ED Notes (Signed)
fsbs 474

## 2019-11-23 NOTE — Progress Notes (Signed)
PHARMACIST - PHYSICIAN COMMUNICATION  CONCERNING:  Enoxaparin (Lovenox) for DVT Prophylaxis    RECOMMENDATION: Patient was prescribed enoxaprin 40mg  q24 hours for VTE prophylaxis.   Filed Weights   11/22/19 1826  Weight: 111.6 kg (246 lb)    Body mass index is 49.69 kg/m.  Estimated Creatinine Clearance: 42.2 mL/min (A) (by C-G formula based on SCr of 1.54 mg/dL (H)).   Based on [facility] policy patient is candidate for enoxaparin 0.5mg /kg TBW SQ every 24 hours based on BMI being >30.  DESCRIPTION: Pharmacy has adjusted enoxaparin dose per [facility] policy.  Patient is now receiving enoxaparin 55 mg every 24 hours   [facility], PharmD, BCPS Clinical Pharmacist 11/23/2019 7:24 AM

## 2019-11-23 NOTE — Progress Notes (Signed)
  Heart Failure Nurse Navigator Note  HFpEF 60% with Grade I Diastolic Dysfunction.  She presented to the ED with complaints of loss of consciousness, dyspnea on exertion, lower extremity edema, SP02 on room air 82%.  Co morbidities:  Diabetes type 2 Hypertension Hyperlipidemia CKD stage III Anxiety/depression Morbid obesity  Medications:  Lipitor 80 mg daily Coreg 12.5 mg BID Lasix 60 mg IV BID Lisinopril 40 mg daily   Labs:  BNP 269, sodium 138, potassium 4.5, BUN 24, creatinine 1.54 (1.78), hemoglobin 10.1, lactic acid 1.4, CRP <0.5, magnesium 2.2   Assessments:  General- she is awake and alert, denies any increasing sob at this time,  HEENT-  Pupils equal, no JVD  Cardiac- heart tones of regular rate and rhythm.  Chest- diminished breath sounds in the bases.  Abdomen-soft, rounded non tender  Musculoskeletal- 1+ lower extremity edema.  Psych- is pleasant and appropriate.  Neuro- speech clear   Spoke with patient, she states she had recently been discharged from her local hospital after being treated for COVID and is in this area seeing family.  She states that her granddaughter witnessed the spell where she lost consciousness. She states she was sitting in a chair and bent over to schatch her foot, remembers feeling SOB with bending over, and that is the last thing she remembers. She had also noted dyspnea on exertion.  Discussed low sodium diet, restricting salt use and limiting the amount of fluids taken in in a 24 hour period.  She was also given heart failure teaching booklet and zone magnet.  Will continue to follow.  Tresa Endo RN, CHFN

## 2019-11-23 NOTE — Progress Notes (Signed)
*  PRELIMINARY RESULTS* Echocardiogram 2D Echocardiogram has been performed.  Cristela Blue 11/23/2019, 11:41 AM

## 2019-11-23 NOTE — ED Notes (Signed)
Lab called to assist with blood draw to verify BMP.

## 2019-11-23 NOTE — Consult Note (Signed)
WOC Nurse Consult Note: Reason for Consult:LEft heel stage 3 pressure injury.  Generalized edema to bilateral lower legs. Patient states she got this wound while in another hospital last month.  Wound type:stage 3 pressure injury left heel Pressure Injury POA: Yes Measurement: 1 cm x 1 cm x 0.2 cm  Wound TDH:RCBU pink Drainage (amount, consistency, odor) minimal serosanguinous  No odor.  Periwound:edema to both feet Dressing procedure/placement/frequency: Cleanse left heel with NS and pat dry.  Apply silicone heel foam to bilateral heels  Change every three days  prevalon boots to bilateral heels.  Will not follow at this time.  Please re-consult if needed.  Maple Hudson MSN, RN, FNP-BC CWON Wound, Ostomy, Continence Nurse Pager [phone]

## 2019-11-23 NOTE — ED Notes (Addendum)
MD made aware of pt's CBG. Insulin not given yet due to parameters stating to consult MD

## 2019-11-23 NOTE — ED Notes (Signed)
Report to Tresa Endo, Charity fundraiser. Pt moving to holding area.

## 2019-11-23 NOTE — ED Notes (Signed)
Pt given meal tray. Pt also given phone and charger that son brought

## 2019-11-23 NOTE — ED Notes (Signed)
Report off to tom rn 

## 2019-11-23 NOTE — ED Notes (Addendum)
meds given   Pt alert and watching tv.

## 2019-11-23 NOTE — Progress Notes (Addendum)
Inpatient Diabetes Program Recommendations  AACE/ADA: New Consensus Statement on Inpatient Glycemic Control   Target Ranges:  Prepandial:   less than 140 mg/dL      Peak postprandial:   less than 180 mg/dL (1-2 hours)      Critically ill patients:  140 - 180 mg/dL   Results for Patricia, Bates (MRN [id number]) as of 11/23/2019 09:08  Ref. Range 11/23/2019 01:21 11/23/2019 04:42 11/23/2019 07:38  Glucose-Capillary Latest Ref Range: 70 - 99 mg/dL 607 (H) 371 (H) 062 (H)  Results for Patricia, Bates (MRN [id number]) as of 11/23/2019 09:08  Ref. Range 11/22/2019 18:44 11/23/2019 05:00  Glucose Latest Ref Range: 70 - 99 mg/dL 035 (H) 009 (H)  Results for Patricia, Bates (MRN [id number]) as of 11/23/2019 14:24  Ref. Range 11/23/2019 05:00  Hemoglobin A1C Latest Ref Range: 4.8 - 5.6 % 8.5 (H)   Review of Glycemic Control  Diabetes history: DM2 Outpatient Diabetes medications: Trulicity 3 mg Qweek, Lantus 70 units QHS, Glipizide 5 mg daily Current orders for Inpatient glycemic control: Lantus 50 units QHS, Novolog 0-20 units TID with meals, Novolog 0-5 units QHS; Solumedrol 40 mg Q12H  Inpatient Diabetes Program Recommendations:    Insulin: If steroids are continued, please consider adding Lantus 20 units QAM to start now and Novolog 10 units TID with meals for meal coverage if patient eats at least 50% of meals.  HbgA1C:  A1C 8.5% on 11/23/19 indicating an average glucose of 197 mg/dl over the past 2-3 months.  Patient was recently hospitalized 10/05/19-10/18/19 with COVID and received steroids.   Outpatient DM needs: Patient reports that she is almost out of all DM medications and requested prescriptions for Lantus SoloStar pens [phone]), Glipizide, and Trulicity. Patient reports that she was told at rehab center she would be prescribed Novolog per correction scale but a prescription was not sent in for Novolog. Patient requested to be discharged on Novolog correction scale and prefers to use vials for the  Novolog [phone]) and insulin syringes [phone]).  Addendum 11/23/19@13 :00-Spoke with patient about diabetes and home regimen for diabetes control. Patient reports being followed by PCP in [facility] for diabetes management. Patient notes that she was in the hospital in [facility] 10/05/19-10/18/19 and then she went to rehab and was just discharged from rehab center this past Friday. Patient states that she has been taking Lantus 70 units QHS, Trulicity 3 mg Qweek on Thursday, and Glipizide 5 mg daily for DM control. Patient states she was told that she was going to be given Novolog per a correction scale from the rehab center but the prescription was never sent to the pharmacy. Patient states that since she was discharged from the rehab center, her glucose has been running 300-400's mg/dl.  Patient states that she will stay here in [place] area for a little longer and then she plans to return to her home in [place]. Patient states she will need refills on all DM medications and she will also need insulin syringes for Novolog (prefers vials for Novolog).  Discussed glucose and A1C goals. Explained that with recent COVID and steroid use, her next A1C is likely to be elevated.  Discussed importance of checking CBGs and maintaining good CBG control to prevent long-term and short-term complications. Discussed impact of nutrition, exercise, stress, sickness, and medications on diabetes control.  Patient reports that she has a follow up appointment with PCP on 11/29/19 and she plans to keep that appointment for follow up. Patient reports that she gets medications at [facility] (  usually in [place]) and asked that when she is discharged that medication prescriptions be sent to [facility] in [place].   Patient verbalized understanding of information discussed and reports no further questions at this time related to diabetes.  Thanks, Orlando Penner, RN, MSN, CDE Diabetes Coordinator Inpatient Diabetes  Program [phone] (Team Pager)

## 2019-11-24 DIAGNOSIS — J9601 Acute respiratory failure with hypoxia: Secondary | ICD-10-CM | POA: Diagnosis not present

## 2019-11-24 DIAGNOSIS — U071 COVID-19: Secondary | ICD-10-CM | POA: Diagnosis not present

## 2019-11-24 DIAGNOSIS — L8990 Pressure ulcer of unspecified site, unspecified stage: Secondary | ICD-10-CM | POA: Insufficient documentation

## 2019-11-24 LAB — GLUCOSE, CAPILLARY
Glucose-Capillary: 187 mg/dL — ABNORMAL HIGH (ref 70–99)
Glucose-Capillary: 212 mg/dL — ABNORMAL HIGH (ref 70–99)
Glucose-Capillary: 266 mg/dL — ABNORMAL HIGH (ref 70–99)
Glucose-Capillary: 331 mg/dL — ABNORMAL HIGH (ref 70–99)

## 2019-11-24 LAB — URINE CULTURE

## 2019-11-24 LAB — BASIC METABOLIC PANEL
Anion gap: 9 (ref 5–15)
BUN: 40 mg/dL — ABNORMAL HIGH (ref 8–23)
CO2: 27 mmol/L (ref 22–32)
Calcium: 8.9 mg/dL (ref 8.9–10.3)
Chloride: 100 mmol/L (ref 98–111)
Creatinine, Ser: 2.46 mg/dL — ABNORMAL HIGH (ref 0.44–1.00)
GFR, Estimated: 22 mL/min — ABNORMAL LOW (ref >60)
Glucose, Bld: 231 mg/dL — ABNORMAL HIGH (ref 70–99)
Potassium: 4.5 mmol/L (ref 3.5–5.1)
Sodium: 136 mmol/L (ref 135–145)

## 2019-11-24 MED ORDER — ADULT MULTIVITAMIN W/MINERALS CH
1.0000 | ORAL_TABLET | Freq: Every day | ORAL | Status: DC
  Administered 2019-11-25 – 2019-11-27 (×3): 1 via ORAL
  Filled 2019-11-24 (×3): qty 1

## 2019-11-24 MED ORDER — NEPRO/CARBSTEADY PO LIQD
237.0000 mL | Freq: Two times a day (BID) | ORAL | Status: DC
  Administered 2019-11-24 – 2019-11-27 (×6): 237 mL via ORAL

## 2019-11-24 NOTE — Progress Notes (Signed)
PROGRESS NOTE    Patricia Bates  DUK:[id number] DOB: [birth year] DOA: 11/22/2019 PCP: Jimmye Norman, NP    Brief Narrative:  64 y.o. female with medical history significant for chronic diastolic CHF (EF 75%, G1 DD by TTE 10/06/2019), T2DM, HTN, HLD, CKD stage III, anxiety/depression, schizoaffective disorder, and morbid obesity who presents to the ED for evaluation of shortness of breath and syncopal event.  Patient was recently hospitalized at [facility] from 10/05/2019-10/18/2019 for AKI on CKD stage III.  She was found to be COVID-19 positive on 9/16.  Per care everywhere documentation, she was initially asymptomatic however did develop supplemental O2 requirement of 2 L via [place].  She was treated short-term with Decadron and was weaned off oxygen prior to discharge.  Presents with evidence of hypoxic respiratory failure and fluid overload.  She was started on combination intravenous steroids as well as intravenous diuretics.  Symptoms have improved over interval.   Assessment & Plan:   Principal Problem:   Acute hypoxemic respiratory failure due to COVID-19 [facility]) Active Problems:   Diabetes mellitus (HCC)   Hypertension associated with diabetes (HCC)   Stage 3b chronic kidney disease (HCC)   Hyperlipidemia associated with type 2 diabetes mellitus (HCC)   Anemia of chronic disease   Acute on chronic diastolic CHF (congestive heart failure) (HCC)   Anxiety and depression   Schizoaffective disorder (HCC)   Pressure injury of skin  Acute hypoxemic respiratory failure due to COVID-19 pneumonitis: Initially SARS-CoV-2 positive 10/06/2019 while admitted at [facility].   X-ray report at that time showed developing right-sided pulmonary infiltrate.   Patient required 2 L of home O2 via [place] at that time.   She was treated only with a few days of Decadron and discharged to home on room air.   Patient returns with hypoxia, SPO2 82% on room air.   CTA chest shows  patchy airspace opacities throughout both lungs likely due to progressive lung call COVID-19 inflammation/pneumonitis. Plan: Initial Covid positive 10/06/2019-no longer requires isolation Continue IV Solu-Medrol 40 mg twice daily If patient continues to improve can likely transition to p.o. prednisone tomorrow -Continue incentive spirometer, flutter valve, Combivent -Antitussives, vitamin C, zinc -Mobilize with PT/OT, recommend home health  Acute on chronic chronic diastolic CHF exacerbation: EF 43%, G1 DD by TTE 10/06/2019.   Patient has bilateral pleural effusions on imaging with peripheral edema on exam. - BNP elevated to 269.3.  Takes Lasix 40 mg daily as an outpatient. -Repeat echo grade 2 diastolic dysfunction with normal EF -Kidney function worsened over interval Plan: Patient received 60 mg IV before lab results were known.  Will discontinue for now Continue strict I's and O's and daily weights Target net negative balance 1 to 1.5 L daily Monitor renal function electrolytes  Syncope: Patient reports transient syncopal episode while at rest prior to admission.   Suspect this was related to hypoxia from the above issues.   EKG shows sinus rhythm.   Will continue to monitor on telemetry.  Insulin-dependent type 2 diabetes with hyperglycemia: Serum glucose 485 on admission without evidence of DKA or HHS.   At risk for worsening glycemic control while requiring steroids. Diabetic coordinator following Plan: Lantus 70 units daily NovoLog 10 units 3 times daily with meals Resistant sliding scale coverage Carb modified diet  Hypertension: BP up to 200/110s on arrival.   Suspect patient's blood pressure suboptimally controlled as outpatient Steroids might also be contributing to hypertension Plan: Continue Coreg and amlodipine for home dose  As needed labetalol   AKI on CKD stage IIIb Kidney function worsened over interval.  Likely secondary to diuretics.  Avoid  nephrotoxins as much as possible.  Diuretic on hold as above  Hyperlipidemia: Continue atorvastatin.  Anemia of chronic disease: Hemoglobin 9.0 on admission compared to 10.1 on 10/17/2019.   No obvious bleeding.  Continue to monitor.  Anxiety/depression/schizoaffective disorder: Continue home Depakote, Effexor, Risperdal.  Obesity BMI 49.69 Complicates overall care and prognosis   DVT prophylaxis: Lovenox Code Status: Full Family Communication: Spouse Elira Colasanti via phone [phone] on 11/24/2019 disposition Plan: Status is: Inpatient  Remains inpatient appropriate because:Inpatient level of care appropriate due to severity of illness   Dispo: The patient is from: Home              Anticipated d/c is to: Home              Anticipated d/c date is: 2 days              Patient currently is not medically stable to d/c.         Consultants:   None  Procedures:   None  Antimicrobials:   None    Subjective: No pain complaints this morning.  Reports her breathing is improved.  On 2 L  Objective: Vitals:   11/24/19 0412 11/24/19 0500 11/24/19 0752 11/24/19 1156  BP: (!) 145/74  (!) 160/92 (!) 167/72  Pulse: 66  (!) 55 69  Resp: Temp: (!) 97.4 F (36.3 C)  97.9 F (36.6 C) 97.9 F (36.6 C)  TempSrc: Oral   Oral  SpO2: 100%  99% 98%  Weight:  114.9 kg    Height:        Intake/Output Summary (Last 24 hours) at 11/24/2019 1303 Last data filed at 11/24/2019 0900 Gross per 24 hour  Intake 240 ml  Output --  Net 240 ml   Filed Weights   11/22/19 1826 11/24/19 0500  Weight: 111.6 kg 114.9 kg    Examination:  General exam: No acute distress Respiratory system: Poor respiratory effort.  Mild bibasilar crackles.  Normal work of breathing.  2 L  cardiovascular system: S1-S2 heard, no murmurs, 3+ pedal edema bilaterally  gastrointestinal system: Nontender, nondistended, normal bowel sounds, obese Central nervous system: Alert and  oriented. No focal neurological deficits. Extremities: Symmetric 5 x 5 power. Skin: No rashes, lesions or ulcers Psychiatry: Judgement and insight appear normal. Mood & affect appropriate.     Data Reviewed: I have personally reviewed following labs and imaging studies  CBC: Recent Labs  Lab 11/22/19 1844 11/23/19 0500  WBC 8.0 6.7  NEUTROABS 5.4  --   HGB 9.0* 10.1*  HCT 29.5* 33.7*  MCV 87.8 89.2  PLT 294 289   Basic Metabolic Panel: Recent Labs  Lab 11/22/19 1844 11/23/19 0500 11/23/19 1013 11/24/19 0953  NA 138 138 134* 136  K 4.5 4.5 4.4 4.5  CL 102 102 99 100  CO2 GLUCOSE 485* 459* 479* 231*  BUN 25* 24* 27* 40*  CREATININE 1.79* 1.54* 1.70* 2.46*  CALCIUM 8.9 8.5* 8.8* 8.9  MG  --  2.2  --   --    GFR: Estimated Creatinine Clearance: 26.9 mL/min (A) (by C-G formula based on SCr of 2.46 mg/dL (H)). Liver Function Tests: Recent Labs  Lab 11/22/19 1844  AST 17  ALT 11  ALKPHOS 51  BILITOT 0.8  PROT 7.3  ALBUMIN 3.2*  No results for input(s): LIPASE, AMYLASE in the last 168 hours. No results for input(s): AMMONIA in the last 168 hours. Coagulation Profile: Recent Labs  Lab 11/22/19 1844  INR 0.9   Cardiac Enzymes: No results for input(s): CKTOTAL, CKMB, CKMBINDEX, TROPONINI in the last 168 hours. BNP (last 3 results) No results for input(s): PROBNP in the last 8760 hours. HbA1C: Recent Labs    11/23/19 0500  HGBA1C 8.5*   CBG: Recent Labs  Lab 11/23/19 1230 11/23/19 1635 11/23/19 2104 11/24/19 0739 11/24/19 1228  GLUCAP 453* 322* 199* 187* 212*   Lipid Profile: Recent Labs    11/22/19 2200  TRIG 161*   Thyroid Function Tests: No results for input(s): TSH, T4TOTAL, FREET4, T3FREE, THYROIDAB in the last 72 hours. Anemia Panel: Recent Labs    11/22/19 2145  FERRITIN 86   Sepsis Labs: Recent Labs  Lab 11/22/19 1844  PROCALCITON <0.10  LATICACIDVEN 1.4    Recent Results (from the past 240 hour(s))  Urine  culture     Status: Abnormal   Collection Time: 11/22/19  4:43 AM   Specimen: Urine, Random  Result Value Ref Range Status   Specimen Description   Final    URINE, RANDOM Performed at [facility], [address], [place], [place] [address]    Special Requests   Final    NONE Performed at [facility], [address] [place], [place] [address]    Culture MULTIPLE SPECIES PRESENT, SUGGEST RECOLLECTION (A)  Final   Report Status 11/24/2019 FINAL  Final  Blood Culture (routine x 2)     Status: None (Preliminary result)   Collection Time: 11/22/19  6:44 PM   Specimen: BLOOD  Result Value Ref Range Status   Specimen Description BLOOD RIGHT ANTECUBITAL  Final   Special Requests   Final    BOTTLES DRAWN AEROBIC AND ANAEROBIC Blood Culture adequate volume   Culture   Final    NO GROWTH 2 DAYS Performed at [facility], [address], [place], [place] [address]    Report Status PENDING  Incomplete  Blood Culture (routine x 2)     Status: None (Preliminary result)   Collection Time: 11/22/19  7:00 PM   Specimen: BLOOD  Result Value Ref Range Status   Specimen Description BLOOD LEFT ANTECUBITAL  Final   Special Requests   Final    BOTTLES DRAWN AEROBIC AND ANAEROBIC Blood Culture adequate volume   Culture   Final    NO GROWTH 2 DAYS Performed at [facility], [address], [place], [place] [address]    Report Status PENDING  Incomplete         Radiology Studies: CT Angio Chest PE W and/or Wo Contrast  Result Date: 11/22/2019 CLINICAL DATA:  Syncopal episode, history of COVID-19 diagnosis EXAM: CT ANGIOGRAPHY CHEST WITH CONTRAST TECHNIQUE: Multidetector CT imaging of the chest was performed using the standard protocol during bolus administration of intravenous contrast. Multiplanar CT image reconstructions and MIPs were obtained to evaluate the vascular anatomy. CONTRAST:  49mL OMNIPAQUE IOHEXOL 350 MG/ML SOLN COMPARISON:  Chest x-ray  from earlier in the same day. FINDINGS: Cardiovascular: Thoracic aorta shows no aneurysmal dilatation or dissection. Pulmonary artery shows a normal branching pattern. No definitive filling defect to suggest pulmonary embolism is seen. No significant coronary calcifications are noted. Mild cardiomegaly is noted. Mediastinum/Nodes: Thoracic inlet is within normal limits. Scattered small hilar and mediastinal lymph nodes are noted likely reactive in nature. The esophagus as  visualized is within normal limits. Lungs/Pleura: Lungs are well aerated bilaterally. Some interstitial edema is noted as well as patchy airspace opacities bilaterally consistent with the given clinical history of COVID-19 positivity. Bilateral pleural effusions are noted. Mild lower lobe atelectatic changes are noted. Upper Abdomen: Visualized upper abdomen is unremarkable. Musculoskeletal: Degenerative changes of the thoracic spine are noted. Review of the MIP images confirms the above findings. IMPRESSION: Patchy airspace opacities consistent with the given clinical history of COVID-19 positivity. Bilateral pleural effusions as well as interstitial edema consistent with superimposed CHF. No evidence of pulmonary emboli. Electronically Signed   By: Alcide Clever M.D.   On: 11/22/2019 20:24   DG Chest Port 1 View  Result Date: 11/22/2019 CLINICAL DATA:  Questionable sepsis - evaluate for abnormality Syncope.  Recent COVID. EXAM: PORTABLE CHEST 1 VIEW COMPARISON:  None. FINDINGS: Lung volumes are low. Mild cardiomegaly. Patchy heterogeneous bilateral airspace opacities in a mid-lower lung zone predominant distribution. There also hazy opacities at the lung bases that may represent small effusions. No pneumothorax. No acute osseous abnormalities are seen. IMPRESSION: 1. Patchy bilateral airspace disease typical of COVID-19 pneumonia. 2. Additional hazy opacity at the lung bases may represent small effusions. Mild cardiomegaly. Electronically  Signed   By: Narda Rutherford M.D.   On: 11/22/2019 18:41   ECHOCARDIOGRAM COMPLETE  Result Date: 11/23/2019    ECHOCARDIOGRAM REPORT   Patient Name:   CATHYRN DEAS Date of Exam: 11/23/2019 Medical Rec #:  [id number]    Height:       59.0 in Accession #:    [id number]   Weight:       246.0 lb Date of Birth:  20-Dec-1955    BSA:          2.014 m Patient Age:    64 years     BP:           184/79 mmHg Patient Gender: F            HR:           79 bpm. Exam Location:  [facility] Procedure: 2D Echo, Cardiac Doppler and Color Doppler Indications:     CHF- acute diastolic 428.31  History:         Patient has no prior history of Echocardiogram examinations.                  Risk Factors:Hypertension, Diabetes and Dyslipidemia.  Sonographer:     Cristela Blue RDCS (AE) Referring Phys:  [id number] Tresa Moore Diagnosing Phys: Debbe Odea MD  Sonographer Comments: Suboptimal apical window. IMPRESSIONS  1. Left ventricular ejection fraction, by estimation, is 60 to 65%. The left ventricle has normal function. The left ventricle has no regional wall motion abnormalities. There is mild left ventricular hypertrophy. Left ventricular diastolic parameters are consistent with Grade II diastolic dysfunction (pseudonormalization).  2. Right ventricular systolic function is normal. The right ventricular size is normal. There is normal pulmonary artery systolic pressure.  3. Left atrial size was mildly dilated.  4. The mitral valve is normal in structure. No evidence of mitral valve regurgitation.  5. The aortic valve is grossly normal. Aortic valve regurgitation is not visualized.  6. The inferior vena cava is normal in size with <50% respiratory variability, suggesting right atrial pressure of 8 mmHg. FINDINGS  Left Ventricle: Left ventricular ejection fraction, by estimation, is 60 to 65%. The left ventricle has normal function. The left ventricle has no regional wall motion abnormalities. The left ventricular  internal cavity size  was normal in size. There is  mild left ventricular hypertrophy. Left ventricular diastolic parameters are consistent with Grade II diastolic dysfunction (pseudonormalization). Right Ventricle: The right ventricular size is normal. No increase in right ventricular wall thickness. Right ventricular systolic function is normal. There is normal pulmonary artery systolic pressure. The tricuspid regurgitant velocity is 1.68 m/s, and  with an assumed right atrial pressure of 8 mmHg, the estimated right ventricular systolic pressure is 19.3 mmHg. Left Atrium: Left atrial size was mildly dilated. Right Atrium: Right atrial size was normal in size. Pericardium: There is no evidence of pericardial effusion. Mitral Valve: The mitral valve is normal in structure. No evidence of mitral valve regurgitation. Tricuspid Valve: The tricuspid valve is normal in structure. Tricuspid valve regurgitation is not demonstrated. Aortic Valve: The aortic valve is grossly normal. Aortic valve regurgitation is not visualized. Aortic valve mean gradient measures 5.0 mmHg. Aortic valve peak gradient measures 8.4 mmHg. Aortic valve area, by VTI measures 2.32 cm. Pulmonic Valve: The pulmonic valve was not well visualized. Pulmonic valve regurgitation is not visualized. Aorta: The aortic root is normal in size and structure. Venous: The inferior vena cava is normal in size with less than 50% respiratory variability, suggesting right atrial pressure of 8 mmHg. IAS/Shunts: No atrial level shunt detected by color flow Doppler.  LEFT VENTRICLE PLAX 2D LVIDd:         4.61 cm  Diastology LVIDs:         2.58 cm  LV e' medial:    4.79 cm/s LV PW:         1.14 cm  LV E/e' medial:  19.7 LV IVS:        1.46 cm  LV e' lateral:   4.79 cm/s LVOT diam:     2.00 cm  LV E/e' lateral: 19.7 LV SV:         71 LV SV Index:   35 LVOT Area:     3.14 cm  RIGHT VENTRICLE RV Basal diam:  3.26 cm RV S prime:     11.50 cm/s LEFT ATRIUM             Index       RIGHT ATRIUM            Index LA diam:        4.30 cm 2.14 cm/m  RA Area:     20.30 cm LA Vol (A2C):   92.4 ml 45.88 ml/m RA Volume:   60.90 ml  30.24 ml/m LA Vol (A4C):   62.6 ml 31.08 ml/m LA Biplane Vol: 75.7 ml 37.59 ml/m  AORTIC VALVE                    PULMONIC VALVE AV Area (Vmax):    1.93 cm     PV Vmax:        0.94 m/s AV Area (Vmean):   1.76 cm     PV Peak grad:   3.5 mmHg AV Area (VTI):     2.32 cm     RVOT Peak grad: 6 mmHg AV Vmax:           144.50 cm/s AV Vmean:          101.350 cm/s AV VTI:            0.306 m AV Peak Grad:      8.4 mmHg AV Mean Grad:      5.0 mmHg LVOT Vmax:  88.70 cm/s LVOT Vmean:        56.800 cm/s LVOT VTI:          0.226 m LVOT/AV VTI ratio: 0.74  AORTA Ao Root diam: 2.50 cm MITRAL VALVE                TRICUSPID VALVE MV Area (PHT): 3.00 cm     TR Peak grad:   11.3 mmHg MV Decel Time: 253 msec     TR Vmax:        168.00 cm/s MV E velocity: 94.60 cm/s MV A velocity: 115.00 cm/s  SHUNTS MV E/A ratio:  0.82         Systemic VTI:  0.23 m                             Systemic Diam: 2.00 cm Debbe OdeaBrian Agbor-Etang MD Electronically signed by Debbe OdeaBrian Agbor-Etang MD Signature Date/Time: 11/23/2019/12:48:05 PM    Final         Scheduled Meds: . vitamin C  1,000 mg Oral Daily  . atorvastatin  80 mg Oral QHS  . carvedilol  12.5 mg Oral BID WC  . divalproex  1,000 mg Oral QHS  . enoxaparin (LOVENOX) injection  0.5 mg/kg Subcutaneous Q24H  . furosemide  60 mg Intravenous Q12H  . insulin aspart  0-20 Units Subcutaneous TID WC  . insulin aspart  0-5 Units Subcutaneous QHS  . insulin aspart  10 Units Subcutaneous TID WC  . insulin glargine  70 Units Subcutaneous Daily  . lisinopril  40 mg Oral Daily  . methylPREDNISolone (SOLU-MEDROL) injection  40 mg Intravenous Q12H  . risperiDONE  1 mg Oral BID  . venlafaxine XR  75 mg Oral Daily  . zinc sulfate  220 mg Oral Daily   Continuous Infusions:   LOS: 2 days    Time spent: 25 minutes    Tresa MooreSudheer B Sherman Lipuma, MD [facility] Pager [phone]  If 7PM-7AM, please contact night-coverage 11/24/2019, 1:03 PM

## 2019-11-24 NOTE — Evaluation (Signed)
Occupational Therapy Evaluation Patient Details Name: Lariah Fleer MRN: [id number] DOB: 09/30/1955 Today's Date: 11/24/2019    History of Present Illness 64 y.o. female with medical history significant for chronic diastolic CHF (EF 46%, G1 DD by TTE 10/06/2019), T2DM, HTN, HLD, CKD stage III, anxiety/depression, schizoaffective disorder, and morbid obesity who presents to the ED for evaluation of shortness of breath and syncopal event.  She was hospitalized for AKI and Covid at a different hospital in September   Clinical Impression   Patient presenting with decreased I in self care, balance, functional mobility/transfers, safety awareness, endurance, and strength. Pt currently on 2 L O2 via [place].  Patient pt reports being independent without use of AD PTA. She lives with husband and 2 grandchildren who can assist as needed. Everyone is involved in IADL tasks at home. She performs self care hygiene while standing at sink. Patient currently functioning at supervision - min guard without use of RW. Pt reports feeling close to her baseline but "no there yet". Pt did give different information regarding home set up to PT vs OT evaluation. Pt reporting this session she lives in 1 level home. Patient will benefit from acute OT to increase overall independence in the areas of ADLs, functional mobility, and safety awareness in order to safely discharge home.    Follow Up Recommendations  Home health OT;Supervision - Intermittent    Equipment Recommendations  3 in 1 bedside commode       Precautions / Restrictions Precautions Precautions: Fall Restrictions Weight Bearing Restrictions: No      Mobility Bed Mobility Overal bed mobility: Needs Assistance Bed Mobility: Sidelying to Sit;Supine to Sit   Sidelying to sit: Min guard Supine to sit: Min assist          Transfers Overall transfer level: Needs assistance Equipment used: None Transfers: Sit to/from [employer] Sit to  Stand: Supervision Stand pivot transfers: Min guard       General transfer comment: without use of AD for toileting needs    Balance Overall balance assessment: Mild deficits observed, not formally tested        ADL either performed or assessed with clinical judgement   ADL Overall ADL's : At baseline;Needs assistance/impaired     Grooming: Wash/dry hands;Wash/dry face;Oral care;Supervision/safety;Set up;Sitting     Toileting- Clothing Manipulation and Hygiene: Min guard;Sit to/from stand   Tub/ Shower Transfer: Min guard;Stand-pivot;Ambulation     General ADL Comments: supervision - min guard without use of RW this session for self care and toileting needs     Vision Patient Visual Report: No change from baseline              Pertinent Vitals/Pain Pain Assessment: No/denies pain     Hand Dominance Right   Extremity/Trunk Assessment Upper Extremity Assessment Upper Extremity Assessment: Generalized weakness   Lower Extremity Assessment Lower Extremity Assessment: Generalized weakness       Communication Communication Communication: No difficulties   Cognition Arousal/Alertness: Awake/alert Behavior During Therapy: WFL for tasks assessed/performed Overall Cognitive Status: Within Functional Limits for tasks assessed    General Comments: A & O x 4. Very cooperative and pleasant during session              Home Living Family/patient expects to be discharged to:: Private residence Living Arrangements: Children Available Help at Discharge: Family;Available 24 hours/day Type of Home: House Home Access: Stairs to enter [employer] of Steps: 3   Home Layout: One level Alternate Level Stairs-Number of  Steps: does need to go up flight to get to bathroom   Bathroom Shower/Tub: Tub/shower unit   Bathroom Toilet: Standard     Home Equipment: Environmental consultant - 2 wheels          Prior Functioning/Environment Level of Independence: Independent         Comments: Pt reports she takes sink baths at home        OT Problem List: Decreased strength;Decreased activity tolerance;Decreased safety awareness;Decreased knowledge of use of DME or AE;Impaired balance (sitting and/or standing);Decreased knowledge of precautions      OT Treatment/Interventions: Self-care/ADL training;Therapeutic exercise;Energy conservation;Therapeutic activities;DME and/or AE instruction;Patient/family education;Balance training    OT Goals(Current goals can be found in the care plan section) Acute Rehab OT Goals Patient Stated Goal: go home OT Goal Formulation: With patient Time For Goal Achievement: 12/08/19 Potential to Achieve Goals: Good ADL Goals Pt Will Perform Grooming: with modified independence;standing Pt Will Transfer to Toilet: with modified independence;ambulating Pt Will Perform Toileting - Clothing Manipulation and hygiene: with modified independence;sit to/from stand  OT Frequency: Min 1X/week   Barriers to D/C:    none known at this time          AM-PAC OT "6 Clicks" Daily Activity     Outcome Measure Help from another person eating meals?: None Help from another person taking care of personal grooming?: A Little Help from another person toileting, which includes using toliet, bedpan, or urinal?: A Little Help from another person bathing (including washing, rinsing, drying)?: A Little Help from another person to put on and taking off regular upper body clothing?: A Little Help from another person to put on and taking off regular lower body clothing?: A Little 6 Click Score: 19   End of Session Nurse Communication: Mobility status;Precautions  Activity Tolerance: Patient tolerated treatment well Patient left: in bed;with call bell/phone within reach;with bed alarm set  OT Visit Diagnosis: Unsteadiness on feet (R26.81);Muscle weakness (generalized) (M62.81)                Time: 1005-1031 OT Time Calculation (min): 26  min Charges:  OT General Charges $OT Visit: 1 Visit OT Evaluation $OT Eval Low Complexity: 1 Low OT Treatments $Self Care/Home Management : 23-37 mins  Jackquline Denmark, MS, OTR/L , CBIS ascom [phone]  11/24/19, 12:43 PM

## 2019-11-24 NOTE — Progress Notes (Signed)
Physical Therapy Treatment Patient Details Name: Patricia Bates MRN: [id number] DOB: 10-Aug-1955 Today's Date: 11/24/2019    History of Present Illness 64 y.o. female with medical history significant for chronic diastolic CHF (EF 45%, G1 DD by TTE 10/06/2019), T2DM, HTN, HLD, CKD stage III, anxiety/depression, schizoaffective disorder, and morbid obesity who presents to the ED for evaluation of shortness of breath and syncopal event.  She was hospitalized for AKI and Covid at a different hospital in September    PT Comments    Pt ready for session.  OOB with rails and supervision.  Able to stand and complete lap around unit with RW and supervision.  Pt on 2 lpm.  Stated she was not on O2 at home.  No balance or safety deficits.    Follow Up Recommendations  Home health PT;Supervision - Intermittent     Equipment Recommendations  None recommended by PT (did discuss SPC as a possible transition back to no AD)    Recommendations for Other Services       Precautions / Restrictions Precautions Precautions: Fall Restrictions Weight Bearing Restrictions: No    Mobility  Bed Mobility Overal bed mobility: Needs Assistance Bed Mobility: Supine to Sit   Sidelying to sit: Min guard Supine to sit: Min assist        Transfers Overall transfer level: Needs assistance Equipment used: None;Rolling walker (2 wheeled) Transfers: Sit to/from [employer] Sit to Stand: Supervision Stand pivot transfers: Min guard       General transfer comment: without use of AD for toileting needs  Ambulation/Gait Ambulation/Gait assistance: Supervision Gait Distance (Feet): 160 Feet Assistive device: Rolling walker (2 wheeled) Gait Pattern/deviations: Step-through pattern Gait velocity: decreased   General Gait Details: able to walk complete lap around unit with ease  " I could walk forever."   Stairs             Wheelchair Mobility    Modified Rankin (Stroke Patients  Only)       Balance Overall balance assessment: Mild deficits observed, not formally tested                                          Cognition Arousal/Alertness: Awake/alert Behavior During Therapy: WFL for tasks assessed/performed Overall Cognitive Status: Within Functional Limits for tasks assessed                                 General Comments: A & O x 4. Very cooperative and pleasant during session      Exercises      General Comments        Pertinent Vitals/Pain Pain Assessment: No/denies pain    Home Living Family/patient expects to be discharged to:: Private residence Living Arrangements: Children Available Help at Discharge: Family;Available 24 hours/day Type of Home: House Home Access: Stairs to enter   Home Layout: One level Home Equipment: Environmental consultant - 2 wheels      Prior Function Level of Independence: Independent      Comments: Pt reports she takes sink baths at home   PT Goals (current goals can now be found in the care plan section) Acute Rehab PT Goals Patient Stated Goal: go home Progress towards PT goals: Progressing toward goals    Frequency    Min 2X/week      PT Plan  Co-evaluation              AM-PAC PT "6 Clicks" Mobility   Outcome Measure  Help needed turning from your back to your side while in a flat bed without using bedrails?: None Help needed moving from lying on your back to sitting on the side of a flat bed without using bedrails?: None Help needed moving to and from a bed to a chair (including a wheelchair)?: None Help needed standing up from a chair using your arms (e.g., wheelchair or bedside chair)?: None Help needed to walk in hospital room?: None Help needed climbing 3-5 steps with a railing? : A Little 6 Click Score: 23    End of Session Equipment Utilized During Treatment: Gait belt;Oxygen (3L) Activity Tolerance: Patient tolerated treatment well   Nurse  Communication: Mobility status PT Visit Diagnosis: Muscle weakness (generalized) (M62.81);Difficulty in walking, not elsewhere classified (R26.2)     Time: 6237-6283 PT Time Calculation (min) (ACUTE ONLY): 11 min  Charges:  $Gait Training: 8-22 mins                    Danielle Dess, PTA 11/24/19, 12:58 PM

## 2019-11-24 NOTE — TOC Initial Note (Signed)
Transition of Care [facility]) - Initial/Assessment Note    Patient Details  Name: Patricia Bates MRN: [id number] Date of Birth: 1955/06/08  Transition [facility] CM/SW Contact:    Chapman Fitch, RN Phone Number: 11/24/2019, 2:19 PM  Clinical Narrative:                 Patient admitted from home with hypoxic respiratory failure Patient states that she lives at home with her adult son  Has a Rw in the home for ambulation.  States that she has a scale in the home for daily weights  Son provides transport to appointments PCP [facility].  Pharmacy [facility] - states she is able to afford all of her medications as they are $3 each  PT and OT has assessed patient and recommends home health.  Patient in agreement and states that she does not have a preference of home health agency.  Referral made to [facility] with [facility] for RN PT and OT  Patient agreeable to [facility] at discharge. Patient currently requiring acute O2.  Will continue to follow for potential need for discharge  Expected Discharge Plan: Home w Home Health Services Barriers to Discharge: Continued Medical Work up   Patient Goals and CMS Choice     Choice offered to / list presented to : Patient  Expected Discharge Plan and Services Expected Discharge Plan: Home w Home Health Services   Discharge Planning Services: CM Consult Post Acute Care Choice: Durable Medical Equipment, Home Health Living arrangements for the past 2 months: Single Family Home                           HH Arranged: RN, PT, OT [facility] Agency: [facility] Date HH Agency Contacted: 11/24/19   Representative spoke with at [facility] Agency: Barbara Cower  Prior Living Arrangements/Services Living arrangements for the past 2 months: Single Family Home Lives with:: Adult Children Patient language and need for interpreter reviewed:: Yes Do you feel safe going back to the place where you live?: Yes      Need for Family Participation in Patient  Care: Yes (Comment) Care giver support system in place?: Yes (comment) Current home services: DME Criminal Activity/Legal Involvement Pertinent to Current Situation/Hospitalization: No - Comment as needed  Activities of Daily Living Home Assistive Devices/Equipment: Dan Humphreys (specify type) (4 wheel) ADL Screening (condition at time of admission) Patient's cognitive ability adequate to safely complete daily activities?: Yes Is the patient deaf or have difficulty hearing?: Yes Does the patient have difficulty seeing, even when wearing glasses/contacts?: Yes (a little blurry, no Rx glasses) Does the patient have difficulty concentrating, remembering, or making decisions?: No Patient able to express need for assistance with ADLs?: Yes Does the patient have difficulty dressing or bathing?: Yes Independently performs ADLs?: No (for past month) Communication: Needs assistance Is this a change from baseline?: Pre-admission baseline Dressing (OT): Needs assistance Is this a change from baseline?: Pre-admission baseline Grooming: Needs assistance Is this a change from baseline?: Pre-admission baseline Feeding: Independent Bathing: Needs assistance Is this a change from baseline?: Pre-admission baseline Toileting: Independent In/Out Bed: Needs assistance Is this a change from baseline?: Pre-admission baseline Walks in Home: Independent Is this a change from baseline?: Pre-admission baseline Does the patient have difficulty walking or climbing stairs?: Yes Weakness of Legs: None Weakness of Arms/Hands: None  Permission Sought/Granted  Emotional Assessment       Orientation: : Oriented to Self, Oriented to Place, Oriented to  Time, Oriented to Situation   Psych Involvement: No (comment)  Admission diagnosis:  Morbid obesity (HCC) [E66.01] Acute respiratory failure with hypoxia (HCC) [J96.01] Type 2 diabetes mellitus without complication, with long-term current use  of insulin (HCC) [E11.9, Z79.4] Multifocal pneumonia [J18.9] Stage 3b chronic kidney disease (HCC) [N18.32] Acute hypoxemic respiratory failure due to COVID-19 (HCC) [U07.1, J96.01] Patient Active Problem List   Diagnosis Date Noted  . Pressure injury of skin 11/24/2019  . Acute hypoxemic respiratory failure due to COVID-19 (HCC) 11/22/2019  . Hyperlipidemia associated with type 2 diabetes mellitus (HCC) 11/22/2019  . Anemia of chronic disease 11/22/2019  . Acute on chronic diastolic CHF (congestive heart failure) (HCC) 11/22/2019  . Anxiety and depression 11/22/2019  . Schizoaffective disorder (HCC) 11/22/2019  . Diabetes mellitus (HCC) 03/09/2017  . Hypertension associated with diabetes (HCC) 03/09/2017  . Stage 3b chronic kidney disease (HCC) 11/04/2016   PCP:  Jimmye Norman, NP Pharmacy:  No Pharmacies Listed    Social Determinants of Health (SDOH) Interventions    Readmission Risk Interventions No flowsheet data found.

## 2019-11-24 NOTE — Progress Notes (Addendum)
Initial Nutrition Assessment  DOCUMENTATION CODES:   Morbid obesity  INTERVENTION:   Nepro Shake po BID, each supplement provides 425 kcal and 19 grams protein  MVI daily   Liberalize diet- RD will add 3g sodium restriction via [employer]   NUTRITION DIAGNOSIS:   Inadequate oral intake related to acute illness as evidenced by meal completion < 50%.  GOAL:   Patient will meet greater than or equal to 90% of their needs  MONITOR:   PO intake, Supplement acceptance, Labs, Weight trends, Skin, I & O's  REASON FOR ASSESSMENT:   Malnutrition Screening Tool    ASSESSMENT:   64 y.o. female with medical history significant for chronic diastolic CHF (EF 29%, G1 DD by TTE 10/06/2019), T2DM, HTN, HLD, CKD stage III, anxiety/depression, schizoaffective disorder, recent COVID 19 and morbid obesity who presents to the ED for evaluation of shortness of breath and syncopal event.   Met with pt in room today. Pt is a poor historian and is only able to provide a limited nutrition related history. Pt reports good appetite and oral intake at baseline. Pt reports that her appetite has been decreased ever since having COVID in September. Pt is documented to be eating 20% of meals in hospital. Pt was eating lunch at time of RD visit today and had eaten ~40% of her meal tray. Pt reports that she does not drink supplements at home but she is willing to try butter pecan flavors in hospital. RD will add supplements and MVI to help pt meet her estimated needs. RD will also liberalize pt's diet to help increase her oral intake. Per chart, pt is weight stable at baseline.   Medications reviewed and include: vitamin C, lovenox, insulin, solu-medrol, zinc  Labs reviewed: BUN 40(H), creat 2.46(H) Hgb 10.1(L), Hct 33.7(L) cbgs- 187, 212 x 24 hrs AIC 8.5(H)- 11/3  NUTRITION - FOCUSED PHYSICAL EXAM:    Most Recent Value  Orbital Region No depletion  Upper Arm Region No depletion  Thoracic and Lumbar  Region No depletion  Buccal Region No depletion  Temple Region No depletion  Clavicle Bone Region No depletion  Clavicle and Acromion Bone Region No depletion  Scapular Bone Region No depletion  Dorsal Hand No depletion  Patellar Region No depletion  Anterior Thigh Region No depletion  Posterior Calf Region No depletion  Edema (RD Assessment) Mild  Hair Reviewed  Eyes Reviewed  Mouth Reviewed  Skin Reviewed  Nails Reviewed     Diet Order:   Diet Order            Diet renal/carb modified with fluid restriction Diet-HS Snack? Nothing; Fluid restriction: 1200 mL Fluid; Room service appropriate? Yes; Fluid consistency: Thin  Diet effective now                EDUCATION NEEDS:   Education needs have been addressed  Skin:  Skin Assessment: Reviewed RN Assessment (Stage III left heel 1 cm x 1 cm x 0.2 cm)  Last BM:  pta  Height:   Ht Readings from Last 1 Encounters:  11/22/19 4' 11" (1.499 m)    Weight:   Wt Readings from Last 1 Encounters:  11/24/19 114.9 kg    Ideal Body Weight:  44.5 kg  BMI:  Body mass index is 51.16 kg/m.  Estimated Nutritional Needs:   Kcal:  2000-2300kcal/day  Protein:  100-115g/day  Fluid:  1.5-1.7L/day  Koleen Distance MS, RD, LDN Please refer to [facility] for RD and/or RD on-call/weekend/after hours pager

## 2019-11-25 DIAGNOSIS — U071 COVID-19: Secondary | ICD-10-CM | POA: Diagnosis not present

## 2019-11-25 DIAGNOSIS — J9601 Acute respiratory failure with hypoxia: Secondary | ICD-10-CM | POA: Diagnosis not present

## 2019-11-25 LAB — BASIC METABOLIC PANEL
Anion gap: 11 (ref 5–15)
BUN: 57 mg/dL — ABNORMAL HIGH (ref 8–23)
CO2: 22 mmol/L (ref 22–32)
Calcium: 9.1 mg/dL (ref 8.9–10.3)
Chloride: 101 mmol/L (ref 98–111)
Creatinine, Ser: 2.9 mg/dL — ABNORMAL HIGH (ref 0.44–1.00)
GFR, Estimated: 18 mL/min — ABNORMAL LOW (ref >60)
Glucose, Bld: 331 mg/dL — ABNORMAL HIGH (ref 70–99)
Potassium: 5 mmol/L (ref 3.5–5.1)
Sodium: 134 mmol/L — ABNORMAL LOW (ref 135–145)

## 2019-11-25 LAB — GLUCOSE, CAPILLARY
Glucose-Capillary: 292 mg/dL — ABNORMAL HIGH (ref 70–99)
Glucose-Capillary: 351 mg/dL — ABNORMAL HIGH (ref 70–99)
Glucose-Capillary: 319 mg/dL — ABNORMAL HIGH (ref 70–99)
Glucose-Capillary: 221 mg/dL — ABNORMAL HIGH (ref 70–99)

## 2019-11-25 MED ORDER — PREDNISONE 20 MG PO TABS
40.0000 mg | ORAL_TABLET | Freq: Every day | ORAL | Status: DC
  Administered 2019-11-25 – 2019-11-26 (×2): 40 mg via ORAL
  Filled 2019-11-25 (×2): qty 2

## 2019-11-25 MED ORDER — INSULIN ASPART 100 UNIT/ML ~~LOC~~ SOLN
14.0000 [IU] | Freq: Three times a day (TID) | SUBCUTANEOUS | Status: DC
  Administered 2019-11-25 – 2019-11-27 (×7): 14 [IU] via SUBCUTANEOUS
  Filled 2019-11-25 (×6): qty 1

## 2019-11-25 NOTE — Progress Notes (Signed)
PROGRESS NOTE    Patricia Bates  HCW:[id number] DOB: [birth year] DOA: 11/22/2019 PCP: Jimmye Norman, NP    Brief Narrative:  64 y.o. female with medical history significant for chronic diastolic CHF (EF 17%, G1 DD by TTE 10/06/2019), T2DM, HTN, HLD, CKD stage III, anxiety/depression, schizoaffective disorder, and morbid obesity who presents to the ED for evaluation of shortness of breath and syncopal event.  Patient was recently hospitalized at [facility] from 10/05/2019-10/18/2019 for AKI on CKD stage III.  She was found to be COVID-19 positive on 9/16.  Per care everywhere documentation, she was initially asymptomatic however did develop supplemental O2 requirement of 2 L via [place].  She was treated short-term with Decadron and was weaned off oxygen prior to discharge.  Presents with evidence of hypoxic respiratory failure and fluid overload.  She was started on combination intravenous steroids as well as intravenous diuretics.  Symptoms have improved over interval.   Assessment & Plan:   Principal Problem:   Acute hypoxemic respiratory failure due to COVID-19 [facility]) Active Problems:   Diabetes mellitus (HCC)   Hypertension associated with diabetes (HCC)   Stage 3b chronic kidney disease (HCC)   Hyperlipidemia associated with type 2 diabetes mellitus (HCC)   Anemia of chronic disease   Acute on chronic diastolic CHF (congestive heart failure) (HCC)   Anxiety and depression   Schizoaffective disorder (HCC)   Pressure injury of skin  Acute hypoxemic respiratory failure due to COVID-19 pneumonitis, improved Initially SARS-CoV-2 positive 10/06/2019 while admitted at [facility].   X-ray report at that time showed developing right-sided pulmonary infiltrate.   Patient required 2 L of home O2 via [place] at that time.   She was treated only with a few days of Decadron and discharged to home on room air.   Patient returns with hypoxia, SPO2 82% on room air.   CTA chest  shows patchy airspace opacities throughout both lungs likely due to progressive lung call COVID-19 inflammation/pneumonitis. Plan: Initial Covid positive 10/06/2019-no longer requires isolation DC IV Solu-Medrol Start prednisone 40 mg p.o. daily.  Begin to taper tomorrow -Continue incentive spirometer, flutter valve, Combivent -Antitussives, vitamin C, zinc -Mobilize with PT/OT, recommend home health  Acute on chronic chronic diastolic CHF exacerbation: EF 61%, G1 DD by TTE 10/06/2019.   Patient has bilateral pleural effusions on imaging with peripheral edema on exam. - BNP elevated to 269.3.  Takes Lasix 40 mg daily as an outpatient. -Repeat echo grade 2 diastolic dysfunction with normal EF -Kidney function worsened over interval Plan: No further diuretics Target net 0 fluid balance Monitor kidney function Avoid nephrotoxins  Syncope: Patient reports transient syncopal episode while at rest prior to admission.   Suspect this was related to hypoxia from the above issues.   EKG shows sinus rhythm.   Will continue to monitor on telemetry.  Insulin-dependent type 2 diabetes with hyperglycemia: Serum glucose 485 on admission without evidence of DKA or HHS.   At risk for worsening glycemic control while requiring steroids. Diabetic coordinator following Plan: Lantus 70 units daily NovoLog 14 units 3 times daily with meals Resistant sliding scale coverage Carb modified diet  Hypertension: BP up to 200/110s on arrival.   Suspect patient's blood pressure suboptimally controlled as outpatient Steroids might also be contributing to hypertension Plan: Continue Coreg and amlodipine for home dose As needed labetalol   AKI on CKD stage IIIb Kidney function worsened over interval.  Likely secondary to diuretics.  Avoid nephrotoxins as much as possible.  Diuretic on hold as above  Hyperlipidemia: Continue atorvastatin.  Anemia of chronic disease: Hemoglobin 9.0 on admission  compared to 10.1 on 10/17/2019.   No obvious bleeding.  Continue to monitor.  Anxiety/depression/schizoaffective disorder: Continue home Depakote, Effexor, Risperdal.  Obesity BMI 49.69 Complicates overall care and prognosis   DVT prophylaxis: Lovenox Code Status: Full Family Communication: Spouse Terressa Koyanagirnest Boyett via phone [phone] on 11/24/2019 disposition Plan: Status is: Inpatient  Remains inpatient appropriate because:Inpatient level of care appropriate due to severity of illness   Dispo: The patient is from: Home              Anticipated d/c is to: Home              Anticipated d/c date is: 2 days              Patient currently is not medically stable to d/c.   Kidney function worsened over interval.  We will plan to monitor in-house for least 24 hours.  Anticipate discharge home with home health once kidney function starts to downtrend.      Consultants:   None  Procedures:   None  Antimicrobials:   None    Subjective: Seen and examined.  Patient without complaint.  On room air.  Creatinine worsened.  Objective: Vitals:   11/25/19 0100 11/25/19 0310 11/25/19 0600 11/25/19 0807  BP: (!) 145/75 (!) 155/61  (!) 161/67  Pulse: (!) 59 (!) 50  69  Resp: 19 16    Temp: 98 F (36.7 C) 98.7 F (37.1 C)  98.6 F (37 C)  TempSrc: Oral Oral  Oral  SpO2: 98% 98% 96% 94%  Weight:   114.2 kg   Height:        Intake/Output Summary (Last 24 hours) at 11/25/2019 1401 Last data filed at 11/25/2019 1055 Gross per 24 hour  Intake 1197 ml  Output 0 ml  Net 1197 ml   Filed Weights   11/22/19 1826 11/24/19 0500 11/25/19 0600  Weight: 111.6 kg 114.9 kg 114.2 kg    Examination:  General exam: No acute distress Respiratory system: Poor respiratory effort.  Mild bibasilar crackles.  Normal work of breathing.  2 L  cardiovascular system: S1-S2 heard, no murmurs, 3+ pedal edema bilaterally  gastrointestinal system: Nontender, nondistended, normal bowel sounds,  obese Central nervous system: Alert and oriented. No focal neurological deficits. Extremities: Symmetric 5 x 5 power. Skin: No rashes, lesions or ulcers Psychiatry: Judgement and insight appear normal. Mood & affect appropriate.     Data Reviewed: I have personally reviewed following labs and imaging studies  CBC: Recent Labs  Lab 11/22/19 1844 11/23/19 0500  WBC 8.0 6.7  NEUTROABS 5.4  --   HGB 9.0* 10.1*  HCT 29.5* 33.7*  MCV 87.8 89.2  PLT 294 289   Basic Metabolic Panel: Recent Labs  Lab 11/22/19 1844 11/23/19 0500 11/23/19 1013 11/24/19 0953 11/25/19 0619  NA 138 138 134* 136 134*  K 4.5 4.5 4.4 4.5 5.0  CL 102 102 99 100 101  CO2 26 23 24 27 22   GLUCOSE 485* 459* 479* 231* 331*  BUN 25* 24* 27* 40* 57*  CREATININE 1.79* 1.54* 1.70* 2.46* 2.90*  CALCIUM 8.9 8.5* 8.8* 8.9 9.1  MG  --  2.2  --   --   --    GFR: Estimated Creatinine Clearance: 22.7 mL/min (A) (by C-G formula based on SCr of 2.9 mg/dL (H)). Liver Function Tests: Recent Labs  Lab 11/22/19 1844  AST 17  ALT 11  ALKPHOS 51  BILITOT 0.8  PROT 7.3  ALBUMIN 3.2*   No results for input(s): LIPASE, AMYLASE in the last 168 hours. No results for input(s): AMMONIA in the last 168 hours. Coagulation Profile: Recent Labs  Lab 11/22/19 1844  INR 0.9   Cardiac Enzymes: No results for input(s): CKTOTAL, CKMB, CKMBINDEX, TROPONINI in the last 168 hours. BNP (last 3 results) No results for input(s): PROBNP in the last 8760 hours. HbA1C: Recent Labs    11/23/19 0500  HGBA1C 8.5*   CBG: Recent Labs  Lab 11/24/19 1228 11/24/19 1618 11/24/19 2102 11/25/19 0755 11/25/19 1208  GLUCAP 212* 266* 331* 292* 351*   Lipid Profile: Recent Labs    11/22/19 2200  TRIG 161*   Thyroid Function Tests: No results for input(s): TSH, T4TOTAL, FREET4, T3FREE, THYROIDAB in the last 72 hours. Anemia Panel: Recent Labs    11/22/19 2145  FERRITIN 86   Sepsis Labs: Recent Labs  Lab 11/22/19 1844    PROCALCITON <0.10  LATICACIDVEN 1.4    Recent Results (from the past 240 hour(s))  Urine culture     Status: Abnormal   Collection Time: 11/22/19  4:43 AM   Specimen: Urine, Random  Result Value Ref Range Status   Specimen Description   Final    URINE, RANDOM Performed at [facility], [address], [place], [place] [address]    Special Requests   Final    NONE Performed at [facility], [address] [place], [place] [address]    Culture MULTIPLE SPECIES PRESENT, SUGGEST RECOLLECTION (A)  Final   Report Status 11/24/2019 FINAL  Final  Blood Culture (routine x 2)     Status: None (Preliminary result)   Collection Time: 11/22/19  6:44 PM   Specimen: BLOOD  Result Value Ref Range Status   Specimen Description BLOOD RIGHT ANTECUBITAL  Final   Special Requests   Final    BOTTLES DRAWN AEROBIC AND ANAEROBIC Blood Culture adequate volume   Culture   Final    NO GROWTH 3 DAYS Performed at [facility], [address] [place], [place] [address]    Report Status PENDING  Incomplete  Blood Culture (routine x 2)     Status: None (Preliminary result)   Collection Time: 11/22/19  7:00 PM   Specimen: BLOOD  Result Value Ref Range Status   Specimen Description BLOOD LEFT ANTECUBITAL  Final   Special Requests   Final    BOTTLES DRAWN AEROBIC AND ANAEROBIC Blood Culture adequate volume   Culture   Final    NO GROWTH 3 DAYS Performed at [facility], [address] [place], [place] [address]    Report Status PENDING  Incomplete         Radiology Studies: No results found.      Scheduled Meds: . vitamin C  1,000 mg Oral Daily  . atorvastatin  80 mg Oral QHS  . carvedilol  12.5 mg Oral BID WC  . divalproex  1,000 mg Oral QHS  . enoxaparin (LOVENOX) injection  0.5 mg/kg Subcutaneous Q24H  . feeding supplement (NEPRO CARB STEADY)  237 mL Oral BID BM  . insulin aspart  0-20 Units Subcutaneous TID WC  . insulin aspart  0-5 Units  Subcutaneous QHS  . insulin aspart  14 Units Subcutaneous TID WC  . insulin glargine  70 Units Subcutaneous Daily  . multivitamin with minerals  1 tablet Oral Daily  . predniSONE  40 mg Oral Q breakfast  . risperiDONE  1 mg Oral BID  . venlafaxine XR  75 mg Oral Daily  . zinc sulfate  220 mg Oral Daily   Continuous Infusions:   LOS: 3 days    Time spent: 25 minutes    Tresa Moore, MD [facility] Pager [phone]  If 7PM-7AM, please contact night-coverage 11/25/2019, 2:01 PM

## 2019-11-25 NOTE — TOC Progression Note (Signed)
Transition of Care [facility]) - Progression Note    Patient Details  Name: Patricia Bates MRN: [id number] Date of Birth: May 13, 1955  Transition of Care [facility]) CM/SW Contact  Chapman Fitch, RN Phone Number: 11/25/2019, 2:56 PM  Clinical Narrative:     Referral made to [facility] with [facility] for [facility].  Patient still requiring acute O2..  RN to check for qualifying home O2 sats    Expected Discharge Plan: Home w Home Health Services Barriers to Discharge: Continued Medical Work up  Expected Discharge Plan and Services Expected Discharge Plan: Home w Home Health Services   Discharge Planning Services: CM Consult Post Acute Care Choice: Durable Medical Equipment, Home Health Living arrangements for the past 2 months: Single Family Home                           HH Arranged: RN, PT, OT [facility] Agency: [facility] Date HH Agency Contacted: 11/24/19   Representative spoke with at [facility] Agency: Barbara Cower   Social Determinants of Health (SDOH) Interventions    Readmission Risk Interventions No flowsheet data found.

## 2019-11-26 ENCOUNTER — Inpatient Hospital Stay: Payer: Medicaid Other

## 2019-11-26 DIAGNOSIS — J9601 Acute respiratory failure with hypoxia: Secondary | ICD-10-CM | POA: Diagnosis not present

## 2019-11-26 DIAGNOSIS — U071 COVID-19: Secondary | ICD-10-CM | POA: Diagnosis not present

## 2019-11-26 LAB — NA AND K (SODIUM & POTASSIUM), RAND UR
Potassium Urine: 17 mmol/L
Sodium, Ur: 22 mmol/L

## 2019-11-26 LAB — BASIC METABOLIC PANEL
Anion gap: 9 (ref 5–15)
BUN: 74 mg/dL — ABNORMAL HIGH (ref 8–23)
CO2: 26 mmol/L (ref 22–32)
Calcium: 9 mg/dL (ref 8.9–10.3)
Chloride: 104 mmol/L (ref 98–111)
Creatinine, Ser: 3.12 mg/dL — ABNORMAL HIGH (ref 0.44–1.00)
GFR, Estimated: 16 mL/min — ABNORMAL LOW (ref >60)
Glucose, Bld: 175 mg/dL — ABNORMAL HIGH (ref 70–99)
Potassium: 4.3 mmol/L (ref 3.5–5.1)
Sodium: 139 mmol/L (ref 135–145)

## 2019-11-26 LAB — GLUCOSE, CAPILLARY
Glucose-Capillary: 125 mg/dL — ABNORMAL HIGH (ref 70–99)
Glucose-Capillary: 141 mg/dL — ABNORMAL HIGH (ref 70–99)
Glucose-Capillary: 248 mg/dL — ABNORMAL HIGH (ref 70–99)
Glucose-Capillary: 246 mg/dL — ABNORMAL HIGH (ref 70–99)

## 2019-11-26 LAB — URINALYSIS, COMPLETE (UACMP) WITH MICROSCOPIC
Bilirubin Urine: NEGATIVE
Glucose, UA: NEGATIVE mg/dL
Ketones, ur: NEGATIVE mg/dL
Leukocytes,Ua: NEGATIVE
Nitrite: NEGATIVE
Protein, ur: 100 mg/dL — AB
Specific Gravity, Urine: 1.01 (ref 1.005–1.030)
WBC, UA: NONE SEEN WBC/hpf (ref 0–5)
pH: 5 (ref 5.0–8.0)

## 2019-11-26 LAB — CREATININE, URINE, RANDOM: Creatinine, Urine: 79 mg/dL

## 2019-11-26 LAB — PROTEIN / CREATININE RATIO, URINE
Creatinine, Urine: 79 mg/dL
Protein Creatinine Ratio: 1.43 mg/mg{Cre} — ABNORMAL HIGH (ref 0.00–0.15)
Total Protein, Urine: 113 mg/dL

## 2019-11-26 MED ORDER — PREDNISONE 20 MG PO TABS
30.0000 mg | ORAL_TABLET | Freq: Every day | ORAL | Status: AC
  Administered 2019-11-27: 30 mg via ORAL
  Filled 2019-11-26: qty 2

## 2019-11-26 MED ORDER — HEPARIN SODIUM (PORCINE) 5000 UNIT/ML IJ SOLN
5000.0000 [IU] | Freq: Three times a day (TID) | INTRAMUSCULAR | Status: DC
  Administered 2019-11-27: 5000 [IU] via SUBCUTANEOUS
  Filled 2019-11-26: qty 1

## 2019-11-26 NOTE — Consult Note (Signed)
[address] [place], [place] [address] Phone [phone]. Fax [phone]  Date: 11/26/2019                  Patient Name:  Patricia Bates  MRN: [id number]  DOB: 15-Jan-1956  Age / Sex: 64 y.o., female         PCP: Jimmye Norman, NP                 Service Requesting Consult: IM/ Tresa Moore, MD                 Reason for Consult: ARF            History of Present Illness: Patient is a 64 y.o. female admitted to [facility] on 11/22/2019 for evaluation of shortness of breath and a syncopal event.  Patient does not remember the events. She states she feels better now According to H&P, she was Covid positive in September, hospitalized at [facility] however [facility] in mid September. She underwent CT angiogram resulting in IV contrast exposure Serum creatinine trends are noted below Lab Results  Component Value Date   CREATININE 3.12 (H) 11/26/2019   CREATININE 2.90 (H) 11/25/2019   CREATININE 2.46 (H) 11/24/2019     Medications: Outpatient medications: Medications Prior to Admission  Medication Sig Dispense Refill Last Dose  . atorvastatin (LIPITOR) 80 MG tablet Take 80 mg by mouth at bedtime.   unknown at unknown  . carvedilol (COREG) 12.5 MG tablet Take 12.5 mg by mouth in the morning and at bedtime.   unknown at unknown  . Cholecalciferol (VITAMIN D3) 125 MCG (5000 UT) TABS Take 1 tablet by mouth daily.   unknown at unknown  . divalproex (DEPAKOTE ER) 500 MG 24 hr tablet Take 1,000 mg by mouth at bedtime.   unknown at unknown  . furosemide (LASIX) 20 MG tablet Take 40 mg by mouth daily.   unknown at unknown  . lisinopril (ZESTRIL) 40 MG tablet Take 40 mg by mouth daily.   unknown at unknown  . risperiDONE (RISPERDAL) 1 MG tablet Take 1 mg by mouth 2 (two) times daily.   unknown at unknown  . TRULICITY 3 MG/0.5ML SOPN Inject 3 mg into the muscle every 7 (seven) days.   unknown at unknown  . venlafaxine XR (EFFEXOR-XR) 75 MG 24 hr  capsule Take 75 mg by mouth daily.   unknown at unknown  . vitamin B-12 (CYANOCOBALAMIN) 250 MCG tablet Take 250 mcg by mouth daily.   unknown at unknown    Current medications: Current Facility-Administered Medications  Medication Dose Route Frequency Provider Last Rate Last Admin  . acetaminophen (TYLENOL) tablet 650 mg  650 mg Oral Q6H PRN Charlsie Quest, MD   650 mg at 11/23/19 2000   Or  . acetaminophen (TYLENOL) suppository 650 mg  650 mg Rectal Q6H PRN Charlsie Quest, MD      . ascorbic acid (VITAMIN C) tablet 1,000 mg  1,000 mg Oral Daily Darreld Mclean R, MD   1,000 mg at 11/26/19 0827  . atorvastatin (LIPITOR) tablet 80 mg  80 mg Oral QHS Charlsie Quest, MD   80 mg at 11/25/19 2147  . carvedilol (COREG) tablet 12.5 mg  12.5 mg Oral BID WC Darreld Mclean R, MD   12.5 mg at 11/26/19 0826  . chlorpheniramine-HYDROcodone (TUSSIONEX) 10-8 MG/5ML suspension 5 mL  5 mL Oral Q12H PRN Charlsie Quest, MD      .  divalproex (DEPAKOTE ER) 24 hr tablet 1,000 mg  1,000 mg Oral QHS Darreld McleanPatel, Vishal R, MD   1,000 mg at 11/25/19 2147  . enoxaparin (LOVENOX) injection 55 mg  0.5 mg/kg Subcutaneous Q24H Albina BilletShanlever, Charles M, RPH   55 mg at 11/26/19 0829  . feeding supplement (NEPRO CARB STEADY) liquid 237 mL  237 mL Oral BID BM Sreenath, Sudheer B, MD 0 mL/hr at 11/25/19 2309 237 mL at 11/26/19 0821  . guaiFENesin-dextromethorphan (ROBITUSSIN DM) 100-10 MG/5ML syrup 5 mL  5 mL Oral Q4H PRN Darreld McleanPatel, Vishal R, MD      . insulin aspart (novoLOG) injection 0-20 Units  0-20 Units Subcutaneous TID WC Charlsie QuestPatel, Vishal R, MD   3 Units at 11/26/19 [phone]  . insulin aspart (novoLOG) injection 0-5 Units  0-5 Units Subcutaneous QHS Charlsie QuestPatel, Vishal R, MD   2 Units at 11/25/19 2146  . insulin aspart (novoLOG) injection 14 Units  14 Units Subcutaneous TID WC Lolita PatellaSreenath, Sudheer B, MD   14 Units at 11/26/19 0829  . insulin glargine (LANTUS) injection 70 Units  70 Units Subcutaneous Daily Lolita PatellaSreenath, Sudheer B, MD   70 Units at  11/26/19 0830  . Ipratropium-Albuterol (COMBIVENT) respimat 1 puff  1 puff Inhalation Q6H PRN Darreld McleanPatel, Vishal R, MD      . labetalol (NORMODYNE) injection 10 mg  10 mg Intravenous Q2H PRN Jimmye Normanuma, Elizabeth Achieng, NP   10 mg at 11/23/19 1427  . multivitamin with minerals tablet 1 tablet  1 tablet Oral Daily Lolita PatellaSreenath, Sudheer B, MD   1 tablet at 11/26/19 0826  . ondansetron (ZOFRAN) tablet 4 mg  4 mg Oral Q6H PRN Charlsie QuestPatel, Vishal R, MD       Or  . ondansetron (ZOFRAN) injection 4 mg  4 mg Intravenous Q6H PRN Darreld McleanPatel, Vishal R, MD      . predniSONE (DELTASONE) tablet 40 mg  40 mg Oral Q breakfast Lolita PatellaSreenath, Sudheer B, MD   40 mg at 11/26/19 0826  . risperiDONE (RISPERDAL) tablet 1 mg  1 mg Oral BID Charlsie QuestPatel, Vishal R, MD   1 mg at 11/26/19 0827  . senna-docusate (Senokot-S) tablet 1 tablet  1 tablet Oral QHS PRN Darreld McleanPatel, Vishal R, MD      . venlafaxine XR (EFFEXOR-XR) 24 hr capsule 75 mg  75 mg Oral Daily Charlsie QuestPatel, Vishal R, MD   75 mg at 11/26/19 0827  . zinc sulfate capsule 220 mg  220 mg Oral Daily Charlsie QuestPatel, Vishal R, MD   220 mg at 11/26/19 [id number]      Allergies: No Known Allergies    Past Medical History: Past Medical History:  Diagnosis Date  . Anemia due to chronic kidney disease   . Anxiety and depression   . Chronic diastolic CHF (congestive heart failure) (HCC)   . CKD (chronic kidney disease), stage III (HCC)   . Hyperlipidemia associated with type 2 diabetes mellitus (HCC)   . Hypertension associated with diabetes (HCC)   . Insulin dependent type 2 diabetes mellitus (HCC)   . Schizoaffective disorder [facility](HCC)      Past Surgical History: Past Surgical History:  Procedure Laterality Date  . Cystoscopy with ureteral stent placement Bilateral 02/18/2016     Family History: Family History  Problem Relation Age of Onset  . Diabetes Mother      Social History: Social History   Socioeconomic History  . Marital status: Unknown    Spouse name: Not on file  . Number of children: Not on  file  .  Years of education: Not on file  . Highest education level: Not on file  Occupational History  . Not on file  Tobacco Use  . Smoking status: Never Smoker  . Smokeless tobacco: Never Used  Substance and Sexual Activity  . Alcohol use: Never  . Drug use: Never  . Sexual activity: Not Currently  Other Topics Concern  . Not on file  Social History Narrative  . Not on file   Social Determinants of Health   Financial Resource Strain:   . Difficulty of Paying Living Expenses: Not on file  Food Insecurity:   . Worried About Programme researcher, broadcasting/film/video in the Last Year: Not on file  . Ran Out of Food in the Last Year: Not on file  Transportation Needs:   . Lack of Transportation (Medical): Not on file  . Lack of Transportation (Non-Medical): Not on file  Physical Activity:   . Days of Exercise per Week: Not on file  . Minutes of Exercise per Session: Not on file  Stress:   . Feeling of Stress : Not on file  Social Connections:   . Frequency of Communication with Friends and Family: Not on file  . Frequency of Social Gatherings with Friends and Family: Not on file  . Attends Religious Services: Not on file  . Active Member of Clubs or Organizations: Not on file  . Attends Banker Meetings: Not on file  . Marital Status: Not on file  Intimate Partner Violence:   . Fear of Current or Ex-Partner: Not on file  . Emotionally Abused: Not on file  . Physically Abused: Not on file  . Sexually Abused: Not on file     Review of Systems: Gen: No fevers or chills, reports generalized weakness HEENT: No vision or hearing complaints CV: At present, no chest pain or shortness of breath Resp: No hemoptysis or sputum production GI: Able to eat without nausea or vomiting GU : Able to void without complaints MS: Ambulates with a walker Derm:    No complaints Psych: No complaints Heme: No complaints Neuro: No complaints Endocrine.  No complaints  Vital Signs: Blood  pressure (!) 153/64, pulse 61, temperature 98.3 F (36.8 C), temperature source Oral, resp. rate 18, height 4\' 11"  (1.499 m), weight 114.2 kg, SpO2 96 %.   Intake/Output Summary (Last 24 hours) at 11/26/2019 0854 Last data filed at 11/26/2019 13/06/2019 Gross per 24 hour  Intake 957 ml  Output 300 ml  Net 657 ml    Weight trends: Filed Weights   11/22/19 1826 11/24/19 0500 11/25/19 0600  Weight: 111.6 kg 114.9 kg 114.2 kg    Physical Exam: General:  Obese lady, laying in the bed, no acute distress  HEENT  moist oral mucous membranes, anicteric  Neck:  Supple, no masses  Lungs:  Mild fine crackles  Heart::  Irregular, A. fib,  Abdomen:  Soft, nontender  Extremities:  2+ edema  Neurologic:  Alert, able to answer questions  Skin:  No acute rashes    Lab results: Basic Metabolic Panel: Recent Labs  Lab 11/23/19 0500 11/23/19 1013 11/24/19 0953 11/25/19 0619 11/26/19 0444  NA 138   < > 136 134* 139  K 4.5   < > 4.5 5.0 4.3  CL 102   < > 100 101 104  CO2 23   < > 27 22 26   GLUCOSE 459*   < > 231* 331* 175*  BUN 24*   < > 40* 57* 74*  CREATININE 1.54*   < > 2.46* 2.90* 3.12*  CALCIUM 8.5*   < > 8.9 9.1 9.0  MG 2.2  --   --   --   --    < > = values in this interval not displayed.    Liver Function Tests: Recent Labs  Lab 11/22/19 1844  AST 17  ALT 11  ALKPHOS 51  BILITOT 0.8  PROT 7.3  ALBUMIN 3.2*   No results for input(s): LIPASE, AMYLASE in the last 168 hours. No results for input(s): AMMONIA in the last 168 hours.  CBC: Recent Labs  Lab 11/22/19 1844 11/23/19 0500  WBC 8.0 6.7  NEUTROABS 5.4  --   HGB 9.0* 10.1*  HCT 29.5* 33.7*  MCV 87.8 89.2  PLT 294 289    Cardiac Enzymes: No results for input(s): CKTOTAL, TROPONINI in the last 168 hours.  BNP: Invalid input(s): POCBNP  CBG: Recent Labs  Lab 11/25/19 0755 11/25/19 1208 11/25/19 1621 11/25/19 2019 11/26/19 0745  GLUCAP 292* 351* 319* 221* 125*    Microbiology: Recent Results (from  the past 720 hour(s))  Urine culture     Status: Abnormal   Collection Time: 11/22/19  4:43 AM   Specimen: Urine, Random  Result Value Ref Range Status   Specimen Description   Final    URINE, RANDOM Performed at [facility], [address] [place], [place] [address]    Special Requests   Final    NONE Performed at [facility], [address] [place], [place] [address]    Culture MULTIPLE SPECIES PRESENT, SUGGEST RECOLLECTION (A)  Final   Report Status 11/24/2019 FINAL  Final  Blood Culture (routine x 2)     Status: None (Preliminary result)   Collection Time: 11/22/19  6:44 PM   Specimen: BLOOD  Result Value Ref Range Status   Specimen Description BLOOD RIGHT ANTECUBITAL  Final   Special Requests   Final    BOTTLES DRAWN AEROBIC AND ANAEROBIC Blood Culture adequate volume   Culture   Final    NO GROWTH 4 DAYS Performed at [facility], [address], [place], [place] [address]    Report Status PENDING  Incomplete  Blood Culture (routine x 2)     Status: None (Preliminary result)   Collection Time: 11/22/19  7:00 PM   Specimen: BLOOD  Result Value Ref Range Status   Specimen Description BLOOD LEFT ANTECUBITAL  Final   Special Requests   Final    BOTTLES DRAWN AEROBIC AND ANAEROBIC Blood Culture adequate volume   Culture   Final    NO GROWTH 4 DAYS Performed at [facility], [address] [place], [place] [address]    Report Status PENDING  Incomplete     Coagulation Studies: No results for input(s): LABPROT, INR in the last 72 hours.  Urinalysis: No results for input(s): COLORURINE, LABSPEC, PHURINE, GLUCOSEU, HGBUR, BILIRUBINUR, KETONESUR, PROTEINUR, UROBILINOGEN, NITRITE, LEUKOCYTESUR in the last 72 hours.  Invalid input(s): APPERANCEUR      Imaging:  No results found.   Assessment & Plan: Pt is a 64 y.o.   female with medical problems of chronic diastolic CHF, diabetes type 2, hypertension, hyperlipidemia, CKD  stage III, anxiety, depression, schizoaffective disorder, morbid obesity, COVID-19 positive on September 16 was admitted on 11/22/2019 with Morbid obesity (HCC) [E66.01] Acute respiratory failure with hypoxia (HCC) [J96.01] Type 2 diabetes mellitus without complication, with long-term current use of insulin (HCC) [E11.9, Z79.4] Multifocal pneumonia [J18.9] Stage  3b chronic kidney disease (HCC) [N18.32] Acute hypoxemic respiratory failure due to COVID-19 (HCC) [U07.1, J96.01]   2D echo from November 23, 2019: LVEF 60 to 65%, grade 2 diastolic dysfunction  #Acute kidney injury with chronic kidney disease stage IIIb #Proteinuria Baseline creatinine of 1.54, GFR 38 on November 3 Serum creatinine trend is worsening since November 3.  Creatinine has peaked at 3.12 today Urinalysis from November 2 shows glucosuria greater than 500, proteinuria, RBCs 21-50, urine culture is negative Patient received IV contrast exposure for CT angiogram on November 2.  60 mL IV contrast was used.  Study is negative for pulmonary embolism.  Patchy airspace opacities consistent with COVID-19 pneumonia.  Bilateral pleural effusion, interstitial edema with superimposed CHF.  AKI seems to be secondary to IV contrast exposure Continue supportive care We will obtain urine protein to creatinine ratio We will obtain screening serologies  #Hematuria We will obtain renal ultrasound   LOS: 4 Taylie Helder 11/6/20218:54 AM    Note: This note was prepared with Dragon dictation. Any transcription errors are unintentional

## 2019-11-26 NOTE — Progress Notes (Signed)
Urinalysis and protein creatine ration, urine has been collected. Confirm with labs and they got urine sample obtained by RN.

## 2019-11-26 NOTE — Progress Notes (Signed)
Patient blood pressure 170/75 HR-99. Administered PRN order of labetalol due to systolic reading above 160. Patient states she does not have any dizziness or headaches at this time. Will continue to monitor to end of shift.

## 2019-11-26 NOTE — Progress Notes (Signed)
PROGRESS NOTE    Patricia HamperJennie Bates  ZOX:[id number] DOB: Oct 18, 1955 DOA: 11/22/2019 PCP: Jimmye Normanuma, Elizabeth Achieng, NP    Brief Narrative:  64 y.o. female with medical history significant for chronic diastolic CHF (EF 81%60%, G1 DD by TTE 10/06/2019), T2DM, HTN, HLD, CKD stage III, anxiety/depression, schizoaffective disorder, and morbid obesity who presents to the ED for evaluation of shortness of breath and syncopal event.  Patient was [facility] from 10/[facility]-10/18/2019 for AKI on CKD stage III.  She was found to be COVID-19 positive on 9/16.  Per care everywhere documentation, she was initially asymptomatic however did develop supplemental O2 requirement of 2 L via [place].  She was treated short-term with Decadron and was weaned off oxygen prior to discharge.  Presents with evidence of hypoxic respiratory failure and fluid overload.  She was started on combination intravenous steroids as well as intravenous diuretics.  Symptoms have improved over interval.  11/6: Kidney function continues to worsen over interval.  Nephrology consult requested.   Assessment & Plan:   Principal Problem:   Acute hypoxemic respiratory failure due to COVID-19 [facility](HCC) Active Problems:   Diabetes mellitus (HCC)   Hypertension associated with diabetes (HCC)   Stage 3b chronic kidney disease (HCC)   Hyperlipidemia associated with type 2 diabetes mellitus (HCC)   Anemia of chronic disease   Acute on chronic diastolic CHF (congestive heart failure) (HCC)   Anxiety and depression   Schizoaffective disorder (HCC)   Pressure injury of skin  Acute hypoxemic respiratory failure due to COVID-19 pneumonitis, improved Initially SARS-CoV-2 positive 10/06/2019 while admitted at [facility].   X-ray report at that time showed developing right-sided pulmonary infiltrate.   Patient required 2 L of home O2 via [place] at that time.   She was treated only with a few days of Decadron and discharged  to home on room air.   Patient returns with hypoxia, SPO2 82% on room air.   CTA chest shows patchy airspace opacities throughout both lungs likely due to progressive lung call COVID-19 inflammation/pneumonitis. Plan: Initial Covid positive 10/06/2019-no longer requires isolation Continue prednisone, taper quickly -Continue incentive spirometer, flutter valve, Combivent -Antitussives, vitamin C, zinc -Mobilize with PT/OT, recommend home health  AKI on CKD stage IIIb Kidney function worsened over interval.   Possible contrast-induced nephropathy Continue supportive care Check urinalysis and urine electrolytes Check renal ultrasound Nephrology consult, recommendations appreciated  Acute on chronic chronic diastolic CHF exacerbation: EF 19%60%, G1 DD by TTE 10/06/2019.   Patient has bilateral pleural effusions on imaging with peripheral edema on exam. - BNP elevated to 269.3.  Takes Lasix 40 mg daily as an outpatient. -Repeat echo grade 2 diastolic dysfunction with normal EF -Kidney function worsened over interval Plan: No further diuretics Target net 0 fluid balance Monitor kidney function Avoid nephrotoxins  Syncope: Patient reports transient syncopal episode while at rest prior to admission.   Suspect this was related to hypoxia from the above issues.   EKG shows sinus rhythm.   Will continue to monitor on telemetry.  Insulin-dependent type 2 diabetes with hyperglycemia: Serum glucose 485 on admission without evidence of DKA or HHS.   At risk for worsening glycemic control while requiring steroids. Diabetic coordinator following Plan: Lantus 70 units daily NovoLog 14 units 3 times daily with meals Resistant sliding scale coverage Carb modified diet  Hypertension: BP up to 200/110s on arrival.   Suspect patient's blood pressure suboptimally controlled as outpatient Steroids might also be contributing to hypertension Plan:  Continue Coreg and amlodipine for home dose As  needed labetalol     Hyperlipidemia: Continue atorvastatin.  Anemia of chronic disease: Hemoglobin 9.0 on admission compared to 10.1 on 10/17/2019.   No obvious bleeding.  Continue to monitor.  Anxiety/depression/schizoaffective disorder: Continue home Depakote, Effexor, Risperdal.  Obesity BMI 49.69 Complicates overall care and prognosis   DVT prophylaxis: Lovenox Code Status: Full Family Communication: Spouse Kailany Dinunzio via phone [phone] on 11/24/2019 disposition Plan: Status is: Inpatient  Remains inpatient appropriate because:Inpatient level of care appropriate due to severity of illness   Dispo: The patient is from: Home              Anticipated d/c is to: Home              Anticipated d/c date is: 1 day              Patient currently is not medically stable to d/c.   Kidney function worsened over interval.  Suspected contrast-induced nephropathy.  Will need to monitor in-house release next 24 hours prior to disposition planning.  If kidney function downtrending approaches baseline can consider discharge at that time.      Consultants:   None  Procedures:   None  Antimicrobials:   None    Subjective: Seen and examined.  Patient without complaint.  On room air.  Creatinine worsened.  Objective: Vitals:   11/26/19 0436 11/26/19 0532 11/26/19 0823 11/26/19 1114  BP: (!) 181/90 (!) 167/86 (!) 153/64 (!) 186/69  Pulse: 76 63 61 61  Resp: 17  18 16   Temp: 98.4 F (36.9 C)  98.3 F (36.8 C) 97.7 F (36.5 C)  TempSrc:   Oral Oral  SpO2: 97%  96% 95%  Weight:      Height:        Intake/Output Summary (Last 24 hours) at 11/26/2019 1255 Last data filed at 11/26/2019 0808 Gross per 24 hour  Intake 240 ml  Output 300 ml  Net -60 ml   Filed Weights   11/22/19 1826 11/24/19 0500 11/25/19 0600  Weight: 111.6 kg 114.9 kg 114.2 kg    Examination:  General exam: No acute distress Respiratory system: Poor respiratory effort.  Mild  bibasilar crackles.  Normal work of breathing.  2 L  cardiovascular system: S1-S2 heard, no murmurs, 3+ pedal edema bilaterally  gastrointestinal system: Nontender, nondistended, normal bowel sounds, obese Central nervous system: Alert and oriented. No focal neurological deficits. Extremities: Symmetric 5 x 5 power. Skin: No rashes, lesions or ulcers Psychiatry: Judgement and insight appear normal. Mood & affect appropriate.     Data Reviewed: I have personally reviewed following labs and imaging studies  CBC: Recent Labs  Lab 11/22/19 1844 11/23/19 0500  WBC 8.0 6.7  NEUTROABS 5.4  --   HGB 9.0* 10.1*  HCT 29.5* 33.7*  MCV 87.8 89.2  PLT 294 289   Basic Metabolic Panel: Recent Labs  Lab 11/23/19 0500 11/23/19 1013 11/24/19 0953 11/25/19 0619 11/26/19 0444  NA 138 134* 136 134* 139  K 4.5 4.4 4.5 5.0 4.3  CL 102 99 100 101 104  CO2 23 24 27 22 26   GLUCOSE 459* 479* 231* 331* 175*  BUN 24* 27* 40* 57* 74*  CREATININE 1.54* 1.70* 2.46* 2.90* 3.12*  CALCIUM 8.5* 8.8* 8.9 9.1 9.0  MG 2.2  --   --   --   --    GFR: Estimated Creatinine Clearance: 21.1 mL/min (A) (by C-G formula based on SCr of  3.12 mg/dL (H)). Liver Function Tests: Recent Labs  Lab 11/22/19 1844  AST 17  ALT 11  ALKPHOS 51  BILITOT 0.8  PROT 7.3  ALBUMIN 3.2*   No results for input(s): LIPASE, AMYLASE in the last 168 hours. No results for input(s): AMMONIA in the last 168 hours. Coagulation Profile: Recent Labs  Lab 11/22/19 1844  INR 0.9   Cardiac Enzymes: No results for input(s): CKTOTAL, CKMB, CKMBINDEX, TROPONINI in the last 168 hours. BNP (last 3 results) No results for input(s): PROBNP in the last 8760 hours. HbA1C: No results for input(s): HGBA1C in the last 72 hours. CBG: Recent Labs  Lab 11/25/19 1208 11/25/19 1621 11/25/19 2019 11/26/19 0745 11/26/19 1111  GLUCAP 351* 319* 221* 125* 141*   Lipid Profile: No results for input(s): CHOL, HDL, LDLCALC, TRIG, CHOLHDL,  LDLDIRECT in the last 72 hours. Thyroid Function Tests: No results for input(s): TSH, T4TOTAL, FREET4, T3FREE, THYROIDAB in the last 72 hours. Anemia Panel: No results for input(s): VITAMINB12, FOLATE, FERRITIN, TIBC, IRON, RETICCTPCT in the last 72 hours. Sepsis Labs: Recent Labs  Lab 11/22/19 1844  PROCALCITON <0.10  LATICACIDVEN 1.4    Recent Results (from the past 240 hour(s))  Urine culture     Status: Abnormal   Collection Time: 11/22/19  4:43 AM   Specimen: Urine, Random  Result Value Ref Range Status   Specimen Description   Final    URINE, RANDOM Performed at [facility], [address], [place], [place] [address]    Special Requests   Final    NONE Performed at [facility], [address] [place], [place] [address]    Culture MULTIPLE SPECIES PRESENT, SUGGEST RECOLLECTION (A)  Final   Report Status 11/24/2019 FINAL  Final  Blood Culture (routine x 2)     Status: None (Preliminary result)   Collection Time: 11/22/19  6:44 PM   Specimen: BLOOD  Result Value Ref Range Status   Specimen Description BLOOD RIGHT ANTECUBITAL  Final   Special Requests   Final    BOTTLES DRAWN AEROBIC AND ANAEROBIC Blood Culture adequate volume   Culture   Final    NO GROWTH 4 DAYS Performed at [facility], [address] [place], [place] [address]    Report Status PENDING  Incomplete  Blood Culture (routine x 2)     Status: None (Preliminary result)   Collection Time: 11/22/19  7:00 PM   Specimen: BLOOD  Result Value Ref Range Status   Specimen Description BLOOD LEFT ANTECUBITAL  Final   Special Requests   Final    BOTTLES DRAWN AEROBIC AND ANAEROBIC Blood Culture adequate volume   Culture   Final    NO GROWTH 4 DAYS Performed at [facility], [address] [place], [place] [address]    Report Status PENDING  Incomplete         Radiology Studies: No results found.      Scheduled Meds: . vitamin C  1,000 mg Oral Daily    . atorvastatin  80 mg Oral QHS  . carvedilol  12.5 mg Oral BID WC  . divalproex  1,000 mg Oral QHS  . enoxaparin (LOVENOX) injection  0.5 mg/kg Subcutaneous Q24H  . feeding supplement (NEPRO CARB STEADY)  237 mL Oral BID BM  . insulin aspart  0-20 Units Subcutaneous TID WC  . insulin aspart  0-5 Units Subcutaneous QHS  . insulin aspart  14 Units Subcutaneous TID WC  . insulin glargine  70 Units Subcutaneous Daily  . multivitamin with minerals  1 tablet Oral Daily  . predniSONE  40 mg Oral Q breakfast  . risperiDONE  1 mg Oral BID  . venlafaxine XR  75 mg Oral Daily  . zinc sulfate  220 mg Oral Daily   Continuous Infusions:   LOS: 4 days    Time spent: 25 minutes    Tresa Moore, MD [facility] Pager [phone]  If 7PM-7AM, please contact night-coverage 11/26/2019, 12:55 PM

## 2019-11-27 DIAGNOSIS — J9601 Acute respiratory failure with hypoxia: Secondary | ICD-10-CM | POA: Diagnosis not present

## 2019-11-27 DIAGNOSIS — U071 COVID-19: Secondary | ICD-10-CM | POA: Diagnosis not present

## 2019-11-27 LAB — BASIC METABOLIC PANEL
Anion gap: 7 (ref 5–15)
BUN: 83 mg/dL — ABNORMAL HIGH (ref 8–23)
CO2: 27 mmol/L (ref 22–32)
Calcium: 8.8 mg/dL — ABNORMAL LOW (ref 8.9–10.3)
Chloride: 103 mmol/L (ref 98–111)
Creatinine, Ser: 2.64 mg/dL — ABNORMAL HIGH (ref 0.44–1.00)
GFR, Estimated: 20 mL/min — ABNORMAL LOW (ref >60)
Glucose, Bld: 207 mg/dL — ABNORMAL HIGH (ref 70–99)
Potassium: 4.4 mmol/L (ref 3.5–5.1)
Sodium: 137 mmol/L (ref 135–145)

## 2019-11-27 LAB — GLUCOSE, CAPILLARY: Glucose-Capillary: 182 mg/dL — ABNORMAL HIGH (ref 70–99)

## 2019-11-27 MED ORDER — TRULICITY 3 MG/0.5ML ~~LOC~~ SOAJ: 3.0000 mg | SUBCUTANEOUS | 0 refills | Status: AC

## 2019-11-27 MED ORDER — PREDNISONE 10 MG PO TABS: 10.0000 mg | ORAL_TABLET | Freq: Every day | ORAL | 0 refills | Status: AC

## 2019-11-27 NOTE — Progress Notes (Signed)
[address] [place], [place] [address] Phone [phone]. Fax [phone]  Date: 11/27/2019                  Patient Name:  Patricia Bates  MRN: [id number]  DOB: 1955-06-01  Age / Sex: 64 y.o., female         PCP: Jimmye Norman, NP                 Service Requesting Consult: IM/ Tresa Moore, MD                 Reason for Consult: ARF            History of Present Illness: Patient is a 63 y.o. female admitted to [facility] on 11/22/2019 for evaluation of shortness of breath and a syncopal event.  Patient does not remember the events. She states she feels better now According to H&P, she was Covid positive in September, hospitalized at [facility] however [facility] in mid September. She underwent CT angiogram resulting in IV contrast exposure Serum creatinine trends are noted below Lab Results  Component Value Date   CREATININE 2.64 (H) 11/27/2019   CREATININE 3.12 (H) 11/26/2019   CREATININE 2.90 (H) 11/25/2019    Today She feels well Denies acute c/o No N/V reported    Vital Signs: Blood pressure (!) 163/68, pulse 62, temperature 98.1 F (36.7 C), temperature source Oral, resp. rate 13, height 4\' 11"  (1.499 m), weight 115.2 kg, SpO2 100 %.   Intake/Output Summary (Last 24 hours) at 11/27/2019 1234 Last data filed at 11/27/2019 0900 Gross per 24 hour  Intake 120 ml  Output 800 ml  Net -680 ml    Weight trends: Filed Weights   11/24/19 0500 11/25/19 0600 11/27/19 0556  Weight: 114.9 kg 114.2 kg 115.2 kg    Physical Exam: General:  Obese lady, laying in the bed, no acute distress  HEENT  moist oral mucous membranes, anicteric  Neck:  Supple, no masses  Lungs:  Mild fine crackles  Heart::  Irregular, A. fib,  Abdomen:  Soft, nontender  Extremities:  2+ edema  Neurologic:  Alert, able to answer questions  Skin:  No acute rashes    Lab results: Basic Metabolic Panel: Recent Labs  Lab 11/23/19 0500  11/23/19 1013 11/25/19 0619 11/26/19 0444 11/27/19 0358  NA 138   < > 134* 139 137  K 4.5   < > 5.0 4.3 4.4  CL 102   < > 101 104 103  CO2 23   < > 22 26 27   GLUCOSE 459*   < > 331* 175* 207*  BUN 24*   < > 57* 74* 83*  CREATININE 1.54*   < > 2.90* 3.12* 2.64*  CALCIUM 8.5*   < > 9.1 9.0 8.8*  MG 2.2  --   --   --   --    < > = values in this interval not displayed.    Liver Function Tests: Recent Labs  Lab 11/22/19 1844  AST 17  ALT 11  ALKPHOS 51  BILITOT 0.8  PROT 7.3  ALBUMIN 3.2*   No results for input(s): LIPASE, AMYLASE in the last 168 hours. No results for input(s): AMMONIA in the last 168 hours.  CBC: Recent Labs  Lab 11/22/19 1844 11/23/19 0500  WBC 8.0 6.7  NEUTROABS 5.4  --   HGB 9.0* 10.1*  HCT 29.5* 33.7*  MCV 87.8 89.2  PLT 294 289  Cardiac Enzymes: No results for input(s): CKTOTAL, TROPONINI in the last 168 hours.  BNP: Invalid input(s): POCBNP  CBG: Recent Labs  Lab 11/26/19 0745 11/26/19 1111 11/26/19 1623 11/26/19 2109 11/27/19 0817  GLUCAP 125* 141* 248* 246* 182*    Microbiology: Recent Results (from the past 720 hour(s))  Urine culture     Status: Abnormal   Collection Time: 11/22/19  4:43 AM   Specimen: Urine, Random  Result Value Ref Range Status   Specimen Description   Final    URINE, RANDOM Performed at [facility], [address] [place], [place] [address]    Special Requests   Final    NONE Performed at [facility], [address] [place], [place] [address]    Culture MULTIPLE SPECIES PRESENT, SUGGEST RECOLLECTION (A)  Final   Report Status 11/24/2019 FINAL  Final  Blood Culture (routine x 2)     Status: None (Preliminary result)   Collection Time: 11/22/19  6:44 PM   Specimen: BLOOD  Result Value Ref Range Status   Specimen Description BLOOD RIGHT ANTECUBITAL  Final   Special Requests   Final    BOTTLES DRAWN AEROBIC AND ANAEROBIC Blood Culture adequate volume   Culture   Final     NO GROWTH 4 DAYS Performed at [facility], [address], [place], [place] [address]    Report Status PENDING  Incomplete  Blood Culture (routine x 2)     Status: None (Preliminary result)   Collection Time: 11/22/19  7:00 PM   Specimen: BLOOD  Result Value Ref Range Status   Specimen Description BLOOD LEFT ANTECUBITAL  Final   Special Requests   Final    BOTTLES DRAWN AEROBIC AND ANAEROBIC Blood Culture adequate volume   Culture   Final    NO GROWTH 4 DAYS Performed at [facility], [address] [place], [place] [address]    Report Status PENDING  Incomplete     Coagulation Studies: No results for input(s): LABPROT, INR in the last 72 hours.  Urinalysis: Recent Labs    11/26/19 0806  COLORURINE YELLOW*  LABSPEC 1.010  PHURINE 5.0  GLUCOSEU NEGATIVE  HGBUR MODERATE*  BILIRUBINUR NEGATIVE  KETONESUR NEGATIVE  PROTEINUR 100*  NITRITE NEGATIVE  LEUKOCYTESUR NEGATIVE        Imaging: US RENAL  Result Date: 11/26/2019 CLINICAL DATA:  Acute kidney injury, history CHF, stage III chronic kidney disease, type II diabetes mellitus, hypertension EXAM: RENAL / URINARY TRACT ULTRASOUND COMPLETE COMPARISON:  None FINDINGS: Right Kidney: Renal measurements: 13.4 x 5.5 x 5.6 cm = volume: 216 mL. Mild cortical thinning. Increased cortical echogenicity. Tiny cyst at inferior pole 12 11 x 10 mm. No additional mass, hydronephrosis, or shadowing calcification. Left Kidney: Renal measurements: 14.1 x 7.1 x 5.8 cm = volume: 309 mL. Mild cortical thinning. Increased cortical echogenicity. No mass, hydronephrosis, or shadowing calcification. Bladder: Appears normal for degree of bladder distention. Other: N/A IMPRESSION: Tiny RIGHT renal cyst. Mild cortical thinning and medical renal disease changes of both kidneys. No evidence of renal mass or hydronephrosis. Electronically Signed   By: Ulyses Southward M.D.   On: 11/26/2019 15:55    Scheduled Meds: . vitamin C  1,000 mg  Oral Daily  . atorvastatin  80 mg Oral QHS  . carvedilol  12.5 mg Oral BID WC  . divalproex  1,000 mg Oral QHS  . feeding supplement (NEPRO CARB STEADY)  237 mL Oral BID BM  . heparin injection (  subcutaneous)  5,000 Units Subcutaneous Q8H  . insulin aspart  0-20 Units Subcutaneous TID WC  . insulin aspart  0-5 Units Subcutaneous QHS  . insulin aspart  14 Units Subcutaneous TID WC  . insulin glargine  70 Units Subcutaneous Daily  . multivitamin with minerals  1 tablet Oral Daily  . risperiDONE  1 mg Oral BID  . venlafaxine XR  75 mg Oral Daily  . zinc sulfate  220 mg Oral Daily   Continuous Infusions: PRN Meds:.acetaminophen **OR** acetaminophen, chlorpheniramine-HYDROcodone, guaiFENesin-dextromethorphan, Ipratropium-Albuterol, labetalol, ondansetron **OR** ondansetron (ZOFRAN) IV, senna-docusate   Assessment & Plan: Pt is a 64 y.o.   female with medical problems of chronic diastolic CHF, diabetes type 2, hypertension, hyperlipidemia, CKD stage III, anxiety, depression, schizoaffective disorder, morbid obesity, COVID-19 positive on September 16 was admitted on 11/22/2019 with Morbid obesity (HCC) [E66.01] Acute respiratory failure with hypoxia (HCC) [J96.01] Type 2 diabetes mellitus without complication, with long-term current use of insulin (HCC) [E11.9, Z79.4] Multifocal pneumonia [J18.9] Stage 3b chronic kidney disease (HCC) [N18.32] Acute hypoxemic respiratory failure due to COVID-19 (HCC) [U07.1, J96.01]   2D echo from November 23, 2019: LVEF 60 to 65%, grade 2 diastolic dysfunction  #Acute kidney injury with chronic kidney disease stage IIIb #Proteinuria Baseline creatinine of 1.54, GFR 38 on November 3 Serum creatinine trend is worsening since November 3.  Creatinine peaked at 3.12    Urinalysis from November 2 shows glucosuria greater than 500, proteinuria, RBCs 21-50, urine culture is negative Patient received IV contrast exposure for CT angiogram on November 2.  60 mL IV  contrast was used.  Study is negative for pulmonary embolism.  Patchy airspace opacities consistent with COVID-19 pneumonia.  Bilateral pleural effusion, interstitial edema with superimposed CHF.  AKI seems to be secondary to IV contrast exposure Creatinine is improving Continue supportive care   #Hematuria renal ultrasound- mild cortical thinning No mass or hydronephrosis UPC 1.43 g Repeat U.A from 11/6 = 0-5 RBCs Will monitor as outpatient   LOS: 5 Zaidyn Claire 11/7/202112:34 PM    Note: This note was prepared with Dragon dictation. Any transcription errors are unintentional

## 2019-11-27 NOTE — Progress Notes (Signed)
Administered PRN labetalol for b/ is 179/74. Patient states no discomofort, no headache, and no dizziness . Will continue to monitor until the end of shift.

## 2019-11-27 NOTE — TOC Transition Note (Addendum)
Transition of Care [facility]) - CM/SW Discharge Note   Patient Details  Name: Patricia Bates MRN: [id number] Date of Birth: August 03, 1955  Transition [facility] CM/SW Contact:  Dominic Pea, LCSW Phone Number: 11/27/2019, 11:52 AM   Clinical Narrative:    Patient medically ready for discharge home with home health today. Confirmed discharge plan with patient via phone and she states son to provide transportation home. Confirmed home health P/T, O/T, and RN with Bonita Quin at [facility]. Also Lucretia with [employer] confirmed bedside commode to be delivered today. Updated RN Ree Kida via secure chat. Patient confirmed she will be picking up her medication after discharge from [facility]. Patient confirmed no other needs, questions, or concerns at this time. TOC signing off.    Final next level of care: Home w Home Health Services Barriers to Discharge: Barriers Resolved   Patient Goals and CMS Choice     Choice offered to / list presented to : Patient  Discharge Placement                    Patient and family notified of of transfer: 11/27/19  Discharge Plan and Services   Discharge Planning Services: CM Consult Post Acute Care Choice: Durable Medical Equipment, Home Health          DME Arranged: Bedside commode DME Agency: [facility] Date DME Agency Contacted: 11/27/19 Time DME Agency Contacted: 1144 Representative spoke with at DME Agency: Lucretia HH Arranged: RN, PT, OT [facility] Agency: [facility] Date HH Agency Contacted: 11/27/19 Time HH Agency Contacted: 1140 Representative spoke with at [facility] Agency: Bonita Quin  Social Determinants of Health (SDOH) Interventions     Readmission Risk Interventions No flowsheet data found.

## 2019-11-27 NOTE — Plan of Care (Signed)
  Problem: Education: Goal: Knowledge of General Education information will improve Description: Including pain rating scale, medication(s)/side effects and non-pharmacologic comfort measures 11/27/2019 0632 by Anastasia Fiedler, RN Outcome: Progressing 11/27/2019 0632 by Milta Deiters A, RN Outcome: Progressing   Problem: Health Behavior/Discharge Planning: Goal: Ability to manage health-related needs will improve 11/27/2019 0632 by Anastasia Fiedler, RN Outcome: Progressing 11/27/2019 0632 by Milta Deiters A, RN Outcome: Progressing   Problem: Clinical Measurements: Goal: Ability to maintain clinical measurements within normal limits will improve 11/27/2019 0632 by Anastasia Fiedler, RN Outcome: Progressing 11/27/2019 0632 by Milta Deiters A, RN Outcome: Progressing Goal: Will remain free from infection 11/27/2019 0932 by Milta Deiters A, RN Outcome: Progressing 11/27/2019 0632 by Milta Deiters A, RN Outcome: Progressing Goal: Diagnostic test results will improve 11/27/2019 0632 by Anastasia Fiedler, RN Outcome: Progressing 11/27/2019 0632 by Milta Deiters A, RN Outcome: Progressing Goal: Respiratory complications will improve 11/27/2019 0632 by Anastasia Fiedler, RN Outcome: Progressing 11/27/2019 0632 by Milta Deiters A, RN Outcome: Progressing Goal: Cardiovascular complication will be avoided 11/27/2019 3557 by Milta Deiters A, RN Outcome: Progressing 11/27/2019 0632 by Milta Deiters A, RN Outcome: Progressing   Problem: Activity: Goal: Risk for activity intolerance will decrease 11/27/2019 3220 by Anastasia Fiedler, RN Outcome: Progressing 11/27/2019 0632 by Milta Deiters A, RN Outcome: Progressing   Problem: Nutrition: Goal: Adequate nutrition will be maintained 11/27/2019 2542 by Milta Deiters A, RN Outcome: Progressing 11/27/2019 0632 by Milta Deiters A, RN Outcome: Progressing   Problem: Coping: Goal: Level of anxiety will decrease 11/27/2019 7062 by Milta Deiters A, RN Outcome: Progressing 11/27/2019 0632 by Milta Deiters A, RN Outcome: Progressing   Problem: Elimination: Goal: Will not experience complications related to bowel motility 11/27/2019 0632 by Anastasia Fiedler, RN Outcome: Progressing 11/27/2019 0632 by Milta Deiters A, RN Outcome: Progressing Goal: Will not experience complications related to urinary retention 11/27/2019 0632 by Anastasia Fiedler, RN Outcome: Progressing 11/27/2019 0632 by Milta Deiters A, RN Outcome: Progressing   Problem: Pain Managment: Goal: General experience of comfort will improve 11/27/2019 0632 by Anastasia Fiedler, RN Outcome: Progressing 11/27/2019 0632 by Milta Deiters A, RN Outcome: Progressing   Problem: Safety: Goal: Ability to remain free from injury will improve 11/27/2019 0632 by Milta Deiters A, RN Outcome: Progressing 11/27/2019 0632 by Milta Deiters A, RN Outcome: Progressing   Problem: Skin Integrity: Goal: Risk for impaired skin integrity will decrease 11/27/2019 0632 by Anastasia Fiedler, RN Outcome: Progressing 11/27/2019 0632 by Anastasia Fiedler, RN Outcome: Progressing

## 2019-11-27 NOTE — Progress Notes (Signed)
Physical Therapy Treatment Patient Details Name: Patricia Bates MRN: [id number] DOB: 05-06-1955 Today's Date: 11/27/2019    History of Present Illness 64 y.o. female with medical history significant for chronic diastolic CHF (EF 21%, G1 DD by TTE 10/06/2019), T2DM, HTN, HLD, CKD stage III, anxiety/depression, schizoaffective disorder, and morbid obesity who presents to the ED for evaluation of shortness of breath and syncopal event.  She was hospitalized for AKI and Covid at a different hospital in September    PT Comments    96% on room air.  Pt able to get out of bed and complete lap with RW and supervision.  Remained in chair after session.  94% on room air after activity.  RN aware.   Follow Up Recommendations  Home health PT;Supervision - Intermittent     Equipment Recommendations  None recommended by PT (did discuss SPC as a possible transition back to no AD)    Recommendations for Other Services       Precautions / Restrictions Precautions Precautions: Fall    Mobility  Bed Mobility Overal bed mobility: Modified Independent                Transfers Overall transfer level: Modified independent                  Ambulation/Gait Ambulation/Gait assistance: Supervision Gait Distance (Feet): 200 Feet Assistive device: Rolling walker (2 wheeled) Gait Pattern/deviations: Step-through pattern Gait velocity: decreased       Stairs             Wheelchair Mobility    Modified Rankin (Stroke Patients Only)       Balance Overall balance assessment: Mild deficits observed, not formally tested                                          Cognition Arousal/Alertness: Awake/alert Behavior During Therapy: WFL for tasks assessed/performed Overall Cognitive Status: Within Functional Limits for tasks assessed                                        Exercises      General Comments        Pertinent Vitals/Pain Pain  Assessment: No/denies pain    Home Living                      Prior Function            PT Goals (current goals can now be found in the care plan section) Progress towards PT goals: Progressing toward goals    Frequency    Min 2X/week      PT Plan      Co-evaluation              AM-PAC PT "6 Clicks" Mobility   Outcome Measure  Help needed turning from your back to your side while in a flat bed without using bedrails?: None Help needed moving from lying on your back to sitting on the side of a flat bed without using bedrails?: None Help needed moving to and from a bed to a chair (including a wheelchair)?: None Help needed standing up from a chair using your arms (e.g., wheelchair or bedside chair)?: None Help needed to walk in hospital room?: None Help needed climbing 3-5 steps  with a railing? : A Little 6 Click Score: 23    End of Session Equipment Utilized During Treatment: Gait belt;Oxygen (3L) Activity Tolerance: Patient tolerated treatment well   Nurse Communication: Mobility status PT Visit Diagnosis: Muscle weakness (generalized) (M62.81);Difficulty in walking, not elsewhere classified (R26.2)     Time: 1116-1130 PT Time Calculation (min) (ACUTE ONLY): 14 min  Charges:  $Gait Training: 8-22 mins                    Danielle Dess, PTA 11/27/19, 11:52 AM

## 2019-11-27 NOTE — Discharge Summary (Signed)
Physician Discharge Summary  Patricia Bates UKG:[id number] DOB: Nov 21, 1955 DOA: 11/22/2019  PCP: Jimmye Norman, NP  Admit date: 11/22/2019 Discharge date: 11/27/2019  Admitted From: Home Disposition:  Home with home health  Recommendations for Outpatient Follow-up:  1. Follow up with PCP in 1-2 weeks 2.   Home Health: Yes Equipment/Devices: None Discharge Condition: Stable CODE STATUS: Full Diet recommendation: Heart Healthy / Carb Modified  Brief/Interim Summary: 64 y.o.femalewith medical history significant forchronic diastolic CHF (EF 76%, G1 DD by TTE 10/06/2019), T2DM, HTN, HLD, CKD stage III, anxiety/depression, schizoaffective disorder, and morbid obesitywho presents to the ED for evaluation of shortness of breath and syncopal event.  Patient was recently hospitalized at [facility] from 10/05/2019-10/18/2019 for AKI on CKD stage III. She was found to be COVID-19 positive on 9/16. Per care everywhere documentation, she was initially asymptomatic however did develop supplemental O2 requirement of 2 L via [place]. She was treated short-term with Decadron and was weaned off oxygen prior to discharge.  Presents with evidence of hypoxic respiratory failure and fluid overload.  She was started on combination intravenous steroids as well as intravenous diuretics.  Symptoms have improved over interval.  11/6: Kidney function continues to worsen over interval.  Nephrology consult requested.  11/7: Kidney function recovering.  Felt to be contrast-induced nephropathy.  Patient urinating without difficulty.  Urine indices and renal ultrasound reassuring.  Patient stable for discharge home at this time.  Lengthy discussion with patient apparently she moved to the area recently does not have established primary care providers.  I explained that we can help her but she needs to set up a PCP for outpatient monitoring.  Patient son is at bedside during time of  discharge.  He expresses understanding.  TOC involved.  Home health services ordered.  Patient discharged home in stable condition.  Discharge Diagnoses:  Principal Problem:   Acute hypoxemic respiratory failure due to COVID-19 [facility]) Active Problems:   Diabetes mellitus (HCC)   Hypertension associated with diabetes (HCC)   Stage 3b chronic kidney disease (HCC)   Hyperlipidemia associated with type 2 diabetes mellitus (HCC)   Anemia of chronic disease   Acute on chronic diastolic CHF (congestive heart failure) (HCC)   Anxiety and depression   Schizoaffective disorder (HCC)   Pressure injury of skin  Acute hypoxemic respiratory failure due to COVID-19 pneumonitis, resolved Initially SARS-CoV-2 positive 10/06/2019 while admitted at [facility].  X-ray report at that time showed developing right-sided pulmonary infiltrate.  Patient required 2 L of home O2 via [place] at that time.  She was treated only with a few days of Decadron and discharged to home on room air.  Patient returns with hypoxia, SPO2 82% on room air.  CTA chest shows patchy airspace opacities throughout both lungs likely due to progressive lung call COVID-19 inflammation/pneumonitis. Started on intravenous steroids in house Patient weaned off supplemental oxygen during hospital admission Will prescribe prednisone taper on discharge PT and OT consulted, recommend home health.  Order  AKI on CKD stage IIIb Kidney function worsened over interval.  Suspected contrast use nephropathy.  Nephrology consulted.  Supportive care recommended.  Kidney function recovery related discharge.  Outpatient follow-up.  Acute on chronic chronic diastolic CHFexacerbation: EF 60%, G1 DD by TTE 10/06/2019.  Patient has bilateral pleural effusions on imaging with peripheral edema on exam.  BNP mildly elevated.  Patient was initially diuresed however kidney function worsened so this had to be stopped.  Patient maintained near net 0 volume status.  Can continue outpatient regimen on discharge.  Syncope: Patient reports transient syncopal episode while at rest prior to admission.  Suspect this was related to hypoxia from the above issues.  Hypoxia resolved.  No further episodes of syncope.  No arrhythmias noted on telemetry.  Home health ordered.  Insulin-dependent type 2 diabetes with hyperglycemia: Serum glucose 485 on admission without evidence of DKA or HHS.  At risk for worsening glycemic control while requiring steroids. Patient is unclear about her home insulin regimen.  Had a lengthy conversation on the date of discharge and she stated she was on 70/30 and Lantus but I cannot see the dose reflected in the system anywhere and patient is unclear exactly who prescribed her this medication.  I explained her the only medication for diabetes also listed was Trulicity which I provided her a 4-week supply of.  TOC in follow-up to establish a primary care physician as the patient is new to the area and states she will be here for least 1 month.  Patient's son is present at bedside expressed understanding of discharge instructions.  Hypertension: BP up to 200/110s on arrival.  Suspect patient's blood pressure suboptimally controlled as outpatient Steroids might also be contributing to hypertension Improved at time of discharge Plan: Continue Coreg and amlodipine for home dose  Hyperlipidemia: Continue atorvastatin.  Anemia of chronic disease: Hemoglobin 9.0 on admission compared to 10.1on9/27/2021.    Anxiety/depression/schizoaffective disorder: Continue home Depakote, Effexor, Risperdal.  Obesity BMI 49.69 Complicates overall care and prognosis  Discharge Instructions  Discharge Instructions    Diet - low sodium heart healthy   Complete by: As directed    Increase activity slowly   Complete by: As directed    No wound care   Complete by: As directed      Allergies as of 11/27/2019   No Known Allergies      Medication List    TAKE these medications   atorvastatin 80 MG tablet Commonly known as: LIPITOR Take 80 mg by mouth at bedtime. Notes to patient: Before bed 11/27/19   carvedilol 12.5 MG tablet Commonly known as: COREG Take 12.5 mg by mouth in the morning and at bedtime. Notes to patient: Before bed 11/27/19   divalproex 500 MG 24 hr tablet Commonly known as: DEPAKOTE ER Take 1,000 mg by mouth at bedtime. Notes to patient: Before bed 11/27/19   furosemide 20 MG tablet Commonly known as: LASIX Take 40 mg by mouth daily. Notes to patient: Morning 11/28/19   lisinopril 40 MG tablet Commonly known as: ZESTRIL Take 40 mg by mouth daily. Notes to patient: Morning 11/28/19   predniSONE 10 MG tablet Commonly known as: DELTASONE Take 1 tablet (10 mg total) by mouth daily. Notes to patient: Morning 11/28/19   risperiDONE 1 MG tablet Commonly known as: RISPERDAL Take 1 mg by mouth 2 (two) times daily. Notes to patient: Evening 11/28/19   Trulicity 3 MG/0.5ML Sopn Generic drug: Dulaglutide Inject 3 mg into the muscle every 7 (seven) days for 4 doses.   venlafaxine XR 75 MG 24 hr capsule Commonly known as: EFFEXOR-XR Take 75 mg by mouth daily. Notes to patient: Morning 11/28/19   vitamin B-12 250 MCG tablet Commonly known as: CYANOCOBALAMIN Take 250 mcg by mouth daily. Notes to patient: Morning 11/28/19   Vitamin D3 125 MCG (5000 UT) Tabs Take 1 tablet by mouth daily. Notes to patient: Morning 11/28/19            Durable Medical Equipment  (From  admission, onward)         Start     Ordered   11/25/19 1321  For home use only DME Bedside commode  Once       Question:  Patient needs a bedside commode to treat with the following condition  Answer:  Weakness   11/25/19 1320          No Known Allergies  Consultations:  Nephrology   Procedures/Studies: CT Angio Chest PE W and/or Wo Contrast  Result Date: 11/22/2019 CLINICAL DATA:  Syncopal episode, history  of COVID-19 diagnosis EXAM: CT ANGIOGRAPHY CHEST WITH CONTRAST TECHNIQUE: Multidetector CT imaging of the chest was performed using the standard protocol during bolus administration of intravenous contrast. Multiplanar CT image reconstructions and MIPs were obtained to evaluate the vascular anatomy. CONTRAST:  60mL OMNIPAQUE IOHEXOL 350 MG/ML SOLN COMPARISON:  Chest x-ray from earlier in the same day. FINDINGS: Cardiovascular: Thoracic aorta shows no aneurysmal dilatation or dissection. Pulmonary artery shows a normal branching pattern. No definitive filling defect to suggest pulmonary embolism is seen. No significant coronary calcifications are noted. Mild cardiomegaly is noted. Mediastinum/Nodes: Thoracic inlet is within normal limits. Scattered small hilar and mediastinal lymph nodes are noted likely reactive in nature. The esophagus as visualized is within normal limits. Lungs/Pleura: Lungs are well aerated bilaterally. Some interstitial edema is noted as well as patchy airspace opacities bilaterally consistent with the given clinical history of COVID-19 positivity. Bilateral pleural effusions are noted. Mild lower lobe atelectatic changes are noted. Upper Abdomen: Visualized upper abdomen is unremarkable. Musculoskeletal: Degenerative changes of the thoracic spine are noted. Review of the MIP images confirms the above findings. IMPRESSION: Patchy airspace opacities consistent with the given clinical history of COVID-19 positivity. Bilateral pleural effusions as well as interstitial edema consistent with superimposed CHF. No evidence of pulmonary emboli. Electronically Signed   By: Alcide Clever M.D.   On: 11/22/2019 20:24   US RENAL  Result Date: 11/26/2019 CLINICAL DATA:  Acute kidney injury, history CHF, stage III chronic kidney disease, type II diabetes mellitus, hypertension EXAM: RENAL / URINARY TRACT ULTRASOUND COMPLETE COMPARISON:  None FINDINGS: Right Kidney: Renal measurements: 13.4 x 5.5 x 5.6 cm =  volume: 216 mL. Mild cortical thinning. Increased cortical echogenicity. Tiny cyst at inferior pole 12 11 x 10 mm. No additional mass, hydronephrosis, or shadowing calcification. Left Kidney: Renal measurements: 14.1 x 7.1 x 5.8 cm = volume: 309 mL. Mild cortical thinning. Increased cortical echogenicity. No mass, hydronephrosis, or shadowing calcification. Bladder: Appears normal for degree of bladder distention. Other: N/A IMPRESSION: Tiny RIGHT renal cyst. Mild cortical thinning and medical renal disease changes of both kidneys. No evidence of renal mass or hydronephrosis. Electronically Signed   By: Ulyses Southward M.D.   On: 11/26/2019 15:55   DG Chest Port 1 View  Result Date: 11/22/2019 CLINICAL DATA:  Questionable sepsis - evaluate for abnormality Syncope.  Recent COVID. EXAM: PORTABLE CHEST 1 VIEW COMPARISON:  None. FINDINGS: Lung volumes are low. Mild cardiomegaly. Patchy heterogeneous bilateral airspace opacities in a mid-lower lung zone predominant distribution. There also hazy opacities at the lung bases that may represent small effusions. No pneumothorax. No acute osseous abnormalities are seen. IMPRESSION: 1. Patchy bilateral airspace disease typical of COVID-19 pneumonia. 2. Additional hazy opacity at the lung bases may represent small effusions. Mild cardiomegaly. Electronically Signed   By: Narda Rutherford M.D.   On: 11/22/2019 18:41   ECHOCARDIOGRAM COMPLETE  Result Date: 11/23/2019    ECHOCARDIOGRAM REPORT  Patient Name:   SINIA ANTOSH Date of Exam: 11/23/2019 Medical Rec #:  [id number]    Height:       59.0 in Accession #:    [id number]   Weight:       246.0 lb Date of Birth:  12-24-1955    BSA:          2.014 m Patient Age:    64 years     BP:           184/79 mmHg Patient Gender: F            HR:           79 bpm. Exam Location:  [facility] Procedure: 2D Echo, Cardiac Doppler and Color Doppler Indications:     CHF- acute diastolic 428.31  History:         Patient has no prior history of  Echocardiogram examinations.                  Risk Factors:Hypertension, Diabetes and Dyslipidemia.  Sonographer:     Cristela Blue RDCS (AE) Referring Phys:  [id number] Tresa Moore Diagnosing Phys: Debbe Odea MD  Sonographer Comments: Suboptimal apical window. IMPRESSIONS  1. Left ventricular ejection fraction, by estimation, is 60 to 65%. The left ventricle has normal function. The left ventricle has no regional wall motion abnormalities. There is mild left ventricular hypertrophy. Left ventricular diastolic parameters are consistent with Grade II diastolic dysfunction (pseudonormalization).  2. Right ventricular systolic function is normal. The right ventricular size is normal. There is normal pulmonary artery systolic pressure.  3. Left atrial size was mildly dilated.  4. The mitral valve is normal in structure. No evidence of mitral valve regurgitation.  5. The aortic valve is grossly normal. Aortic valve regurgitation is not visualized.  6. The inferior vena cava is normal in size with <50% respiratory variability, suggesting right atrial pressure of 8 mmHg. FINDINGS  Left Ventricle: Left ventricular ejection fraction, by estimation, is 60 to 65%. The left ventricle has normal function. The left ventricle has no regional wall motion abnormalities. The left ventricular internal cavity size was normal in size. There is  mild left ventricular hypertrophy. Left ventricular diastolic parameters are consistent with Grade II diastolic dysfunction (pseudonormalization). Right Ventricle: The right ventricular size is normal. No increase in right ventricular wall thickness. Right ventricular systolic function is normal. There is normal pulmonary artery systolic pressure. The tricuspid regurgitant velocity is 1.68 m/s, and  with an assumed right atrial pressure of 8 mmHg, the estimated right ventricular systolic pressure is 19.3 mmHg. Left Atrium: Left atrial size was mildly dilated. Right Atrium: Right atrial  size was normal in size. Pericardium: There is no evidence of pericardial effusion. Mitral Valve: The mitral valve is normal in structure. No evidence of mitral valve regurgitation. Tricuspid Valve: The tricuspid valve is normal in structure. Tricuspid valve regurgitation is not demonstrated. Aortic Valve: The aortic valve is grossly normal. Aortic valve regurgitation is not visualized. Aortic valve mean gradient measures 5.0 mmHg. Aortic valve peak gradient measures 8.4 mmHg. Aortic valve area, by VTI measures 2.32 cm. Pulmonic Valve: The pulmonic valve was not well visualized. Pulmonic valve regurgitation is not visualized. Aorta: The aortic root is normal in size and structure. Venous: The inferior vena cava is normal in size with less than 50% respiratory variability, suggesting right atrial pressure of 8 mmHg. IAS/Shunts: No atrial level shunt detected by color flow Doppler.  LEFT VENTRICLE PLAX 2D LVIDd:  4.61 cm  Diastology LVIDs:         2.58 cm  LV e' medial:    4.79 cm/s LV PW:         1.14 cm  LV E/e' medial:  19.7 LV IVS:        1.46 cm  LV e' lateral:   4.79 cm/s LVOT diam:     2.00 cm  LV E/e' lateral: 19.7 LV SV:         71 LV SV Index:   35 LVOT Area:     3.14 cm  RIGHT VENTRICLE RV Basal diam:  3.26 cm RV S prime:     11.50 cm/s LEFT ATRIUM             Index       RIGHT ATRIUM           Index LA diam:        4.30 cm 2.14 cm/m  RA Area:     20.30 cm LA Vol (A2C):   92.4 ml 45.88 ml/m RA Volume:   60.90 ml  30.24 ml/m LA Vol (A4C):   62.6 ml 31.08 ml/m LA Biplane Vol: 75.7 ml 37.59 ml/m  AORTIC VALVE                    PULMONIC VALVE AV Area (Vmax):    1.93 cm     PV Vmax:        0.94 m/s AV Area (Vmean):   1.76 cm     PV Peak grad:   3.5 mmHg AV Area (VTI):     2.32 cm     RVOT Peak grad: 6 mmHg AV Vmax:           144.50 cm/s AV Vmean:          101.350 cm/s AV VTI:            0.306 m AV Peak Grad:      8.4 mmHg AV Mean Grad:      5.0 mmHg LVOT Vmax:         88.70 cm/s LVOT Vmean:         56.800 cm/s LVOT VTI:          0.226 m LVOT/AV VTI ratio: 0.74  AORTA Ao Root diam: 2.50 cm MITRAL VALVE                TRICUSPID VALVE MV Area (PHT): 3.00 cm     TR Peak grad:   11.3 mmHg MV Decel Time: 253 msec     TR Vmax:        168.00 cm/s MV E velocity: 94.60 cm/s MV A velocity: 115.00 cm/s  SHUNTS MV E/A ratio:  0.82         Systemic VTI:  0.23 m                             Systemic Diam: 2.00 cm Debbe OdeaBrian Agbor-Etang MD Electronically signed by Debbe OdeaBrian Agbor-Etang MD Signature Date/Time: 11/23/2019/12:48:05 PM    Final     (Echo, Carotid, EGD, Colonoscopy, ERCP)    Subjective: Seen and examined the day of discharge.  Hemodynamically stable.  In no distress.  On room air.  Stable for discharge home.  Home health ordered  Discharge Exam: Vitals:   11/27/19 0556 11/27/19 0819  BP: (!) 179/74 (!) 163/68  Pulse: (!) 56 62  Resp: 16 13  Temp: 97.8 F (36.6 C) 98.1 F (36.7 C)  SpO2: 98% 100%   Vitals:   11/26/19 2013 11/26/19 2228 11/27/19 0556 11/27/19 0819  BP: (!) 169/70 (!) 170/75 (!) 179/74 (!) 163/68  Pulse: 71 66 (!) 56 62  Resp: 16  16 13   Temp: 98.5 F (36.9 C)  97.8 F (36.6 C) 98.1 F (36.7 C)  TempSrc: Oral  Oral Oral  SpO2: 99%  98% 100%  Weight:   115.2 kg   Height:        General: Pt is alert, awake, not in acute distress Cardiovascular: RRR, S1/S2 +, no rubs, no gallops Respiratory: CTA bilaterally, no wheezing, no rhonchi Abdominal: Soft, NT, ND, bowel sounds + Extremities: no edema, no cyanosis    The results of significant diagnostics from this hospitalization (including imaging, microbiology, ancillary and laboratory) are listed below for reference.     Microbiology: Recent Results (from the past 240 hour(s))  Urine culture     Status: Abnormal   Collection Time: 11/22/19  4:43 AM   Specimen: Urine, Random  Result Value Ref Range Status   Specimen Description   Final    URINE, RANDOM Performed at [facility], [address]  [place], [place]    Special Requests   Final    NONE Performed at [facility], [address] [place], [place]    Culture MULTIPLE SPECIES PRESENT, SUGGEST RECOLLECTION (A)  Final   Report Status 11/24/2019 FINAL  Final  Blood Culture (routine x 2)     Status: None (Preliminary result)   Collection Time: 11/22/19  6:44 PM   Specimen: BLOOD  Result Value Ref Range Status   Specimen Description BLOOD RIGHT ANTECUBITAL  Final   Special Requests   Final    BOTTLES DRAWN AEROBIC AND ANAEROBIC Blood Culture adequate volume   Culture   Final    NO GROWTH 4 DAYS Performed at [facility], [address] [place], [place]    Report Status PENDING  Incomplete  Blood Culture (routine x 2)     Status: None (Preliminary result)   Collection Time: 11/22/19  7:00 PM   Specimen: BLOOD  Result Value Ref Range Status   Specimen Description BLOOD LEFT ANTECUBITAL  Final   Special Requests   Final    BOTTLES DRAWN AEROBIC AND ANAEROBIC Blood Culture adequate volume   Culture   Final    NO GROWTH 4 DAYS Performed at [facility], [address] [place], [place]    Report Status PENDING  Incomplete     Labs: BNP (last 3 results) Recent Labs    11/22/19 1844  BNP 269.3*   Basic Metabolic Panel: Recent Labs  Lab 11/23/19 0500 11/23/19 0500 11/23/19 1013 11/24/19 0953 11/25/19 0619 11/26/19 0444 11/27/19 0358  NA 138   < > 134* 136 134* 139 137  K 4.5   < > 4.4 4.5 5.0 4.3 4.4  CL 102   < > 99 100 101 104 103  CO2 23   < > 24 27 22 26 27   GLUCOSE 459*   < > 479* 231* 331* 175* 207*  BUN 24*   < > 27* 40* 57* 74* 83*  CREATININE 1.54*   < > 1.70* 2.46* 2.90* 3.12* 2.64*  CALCIUM 8.5*   < > 8.8* 8.9 9.1 9.0 8.8*  MG 2.2  --   --   --   --   --   --    < > =  values in this interval not displayed.   Liver Function Tests: Recent Labs  Lab 11/22/19 1844  AST 17  ALT 11  ALKPHOS 51  BILITOT 0.8  PROT 7.3   ALBUMIN 3.2*   No results for input(s): LIPASE, AMYLASE in the last 168 hours. No results for input(s): AMMONIA in the last 168 hours. CBC: Recent Labs  Lab 11/22/19 1844 11/23/19 0500  WBC 8.0 6.7  NEUTROABS 5.4  --   HGB 9.0* 10.1*  HCT 29.5* 33.7*  MCV 87.8 89.2  PLT 294 289   Cardiac Enzymes: No results for input(s): CKTOTAL, CKMB, CKMBINDEX, TROPONINI in the last 168 hours. BNP: Invalid input(s): POCBNP CBG: Recent Labs  Lab 11/26/19 0745 11/26/19 1111 11/26/19 1623 11/26/19 2109 11/27/19 0817  GLUCAP 125* 141* 248* 246* 182*   D-Dimer No results for input(s): DDIMER in the last 72 hours. Hgb A1c No results for input(s): HGBA1C in the last 72 hours. Lipid Profile No results for input(s): CHOL, HDL, LDLCALC, TRIG, CHOLHDL, LDLDIRECT in the last 72 hours. Thyroid function studies No results for input(s): TSH, T4TOTAL, T3FREE, THYROIDAB in the last 72 hours.  Invalid input(s): FREET3 Anemia work up No results for input(s): VITAMINB12, FOLATE, FERRITIN, TIBC, IRON, RETICCTPCT in the last 72 hours. Urinalysis    Component Value Date/Time   COLORURINE YELLOW (A) 11/26/2019 0806   APPEARANCEUR CLOUDY (A) 11/26/2019 0806   LABSPEC 1.010 11/26/2019 0806   PHURINE 5.0 11/26/2019 0806   GLUCOSEU NEGATIVE 11/26/2019 0806   HGBUR MODERATE (A) 11/26/2019 0806   BILIRUBINUR NEGATIVE 11/26/2019 0806   KETONESUR NEGATIVE 11/26/2019 0806   PROTEINUR 100 (A) 11/26/2019 0806   NITRITE NEGATIVE 11/26/2019 0806   LEUKOCYTESUR NEGATIVE 11/26/2019 0806   Sepsis Labs Invalid input(s): PROCALCITONIN,  WBC,  LACTICIDVEN Microbiology Recent Results (from the past 240 hour(s))  Urine culture     Status: Abnormal   Collection Time: 11/22/19  4:43 AM   Specimen: Urine, Random  Result Value Ref Range Status   Specimen Description   Final    URINE, RANDOM Performed at [facility], [address], [place], [place] [address]    Special Requests   Final     NONE Performed at [facility], [address] [place], [place] [address]    Culture MULTIPLE SPECIES PRESENT, SUGGEST RECOLLECTION (A)  Final   Report Status 11/24/2019 FINAL  Final  Blood Culture (routine x 2)     Status: None (Preliminary result)   Collection Time: 11/22/19  6:44 PM   Specimen: BLOOD  Result Value Ref Range Status   Specimen Description BLOOD RIGHT ANTECUBITAL  Final   Special Requests   Final    BOTTLES DRAWN AEROBIC AND ANAEROBIC Blood Culture adequate volume   Culture   Final    NO GROWTH 4 DAYS Performed at [facility], [address] [place], [place] [address]    Report Status PENDING  Incomplete  Blood Culture (routine x 2)     Status: None (Preliminary result)   Collection Time: 11/22/19  7:00 PM   Specimen: BLOOD  Result Value Ref Range Status   Specimen Description BLOOD LEFT ANTECUBITAL  Final   Special Requests   Final    BOTTLES DRAWN AEROBIC AND ANAEROBIC Blood Culture adequate volume   Culture   Final    NO GROWTH 4 DAYS Performed at [facility], [address] [place], [place] [address]    Report Status PENDING  Incomplete  Time coordinating discharge: Over 30 minutes  SIGNED:   Tresa Moore, MD  [facility] 11/27/2019, 2:17 PM Pager   If 7PM-7AM, please contact night-coverage

## 2019-11-27 NOTE — Progress Notes (Signed)
Inpatient Diabetes Program Recommendations  AACE/ADA: New Consensus Statement on Inpatient Glycemic Control (2015)  Target Ranges:  Prepandial:   less than 140 mg/dL      Peak postprandial:   less than 180 mg/dL (1-2 hours)      Critically ill patients:  140 - 180 mg/dL   Lab Results  Component Value Date   GLUCAP 182 (H) 11/27/2019   HGBA1C 8.5 (H) 11/23/2019    Review of Glycemic Control  Current orders for Inpatient glycemic control:  Lantus 70 units QD Novolog 0-20 units tidwc and 0-5 units QHS Novolog 14 units tidwc for meal coverage  On Prednisone 30 units QAM Post-prandials elevated in 200s.  Inpatient Diabetes Program Recommendations:    Increase Novolog to 15 units tidwc for meal coverage insulin   Continue to look at glucose trends while inpatient.  Thank you. Ailene Ards, RD, LDN, CDE Inpatient Diabetes Coordinator [phone] Increase

## 2019-11-28 LAB — ANCA TITERS
Atypical P-ANCA titer: <1:20 {titer}
C-ANCA: <1:20 {titer}
P-ANCA: <1:20 {titer}

## 2019-11-29 LAB — MPO/PR-3 (ANCA) ANTIBODIES
ANCA Proteinase 3: <3.5 U/mL (ref 0.0–3.5)
Myeloperoxidase Abs: <9 U/mL (ref 0.0–9.0)

## 2019-11-29 LAB — KAPPA/LAMBDA LIGHT CHAINS
Kappa free light chain: 36.4 mg/L — ABNORMAL HIGH (ref 3.3–19.4)
Kappa, lambda light chain ratio: 1.2 (ref 0.26–1.65)
Lambda free light chains: 30.4 mg/L — ABNORMAL HIGH (ref 5.7–26.3)

## 2019-11-29 LAB — C3 COMPLEMENT: C3 Complement: 126 mg/dL (ref 82–167)

## 2019-11-29 LAB — C4 COMPLEMENT: Complement C4, Body Fluid: 33 mg/dL (ref 12–38)

## 2019-11-29 LAB — ANA W/REFLEX IF POSITIVE: Anti Nuclear Antibody (ANA): NEGATIVE

## 2019-12-01 LAB — CULTURE, BLOOD (ROUTINE X 2)
Culture: NO GROWTH
Culture: NO GROWTH
Special Requests: ADEQUATE
Special Requests: ADEQUATE

## 2021-06-20 DEATH — deceased

## 2021-11-28 IMAGING — CT CT ANGIO CHEST
2 of 6 series · 18 of 46 positions shown · IV contrast (APPLIED)
Comparison: Chest x-ray from earlier in the same day.

CLINICAL DATA: Syncopal episode, history of 4RSS0-6W diagnosis

EXAM:
CT ANGIOGRAPHY CHEST WITH CONTRAST
TECHNIQUE: Multidetector CT imaging of the chest was performed using the
standard protocol during bolus administration of intravenous
contrast. Multiplanar CT image reconstructions and MIPs were
obtained to evaluate the vascular anatomy.
CONTRAST:  60mL OMNIPAQUE IOHEXOL 350 MG/ML SOLN

[Series 5: thins · axial · 0.66mm/px · z∈[+456,+686]mm · 15 of 252 slices shown]
[im 11/252  lung]
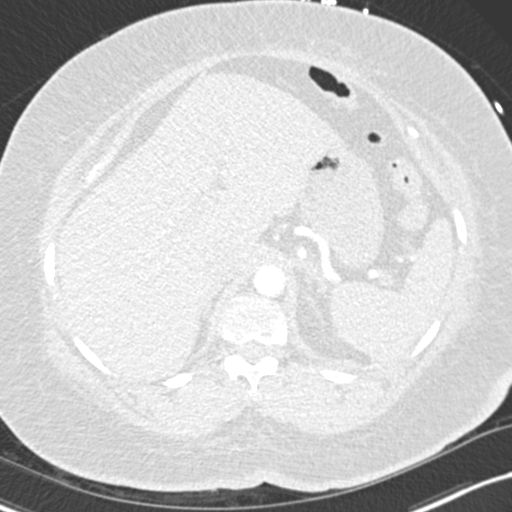
[im 33/252  soft-tissue]
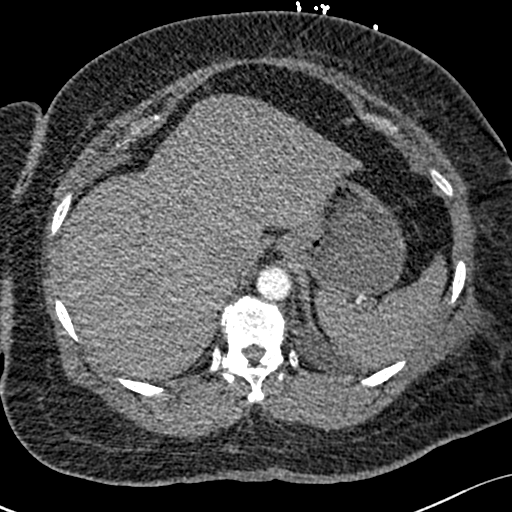
[im 44/252  lung]
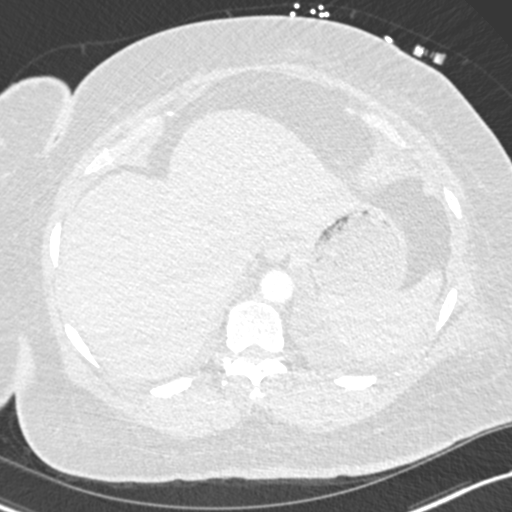
[im 66/252  soft-tissue]
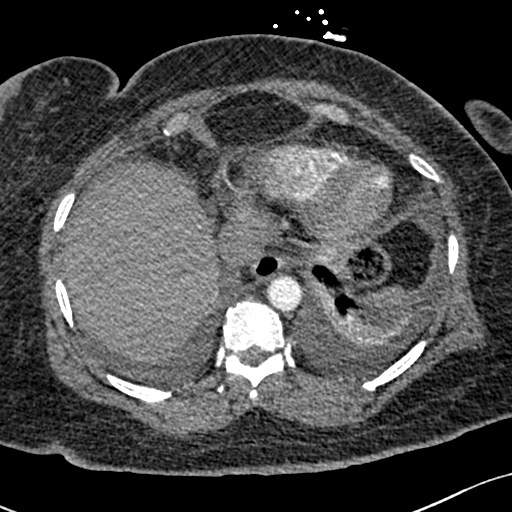
[im 77/252  lung]
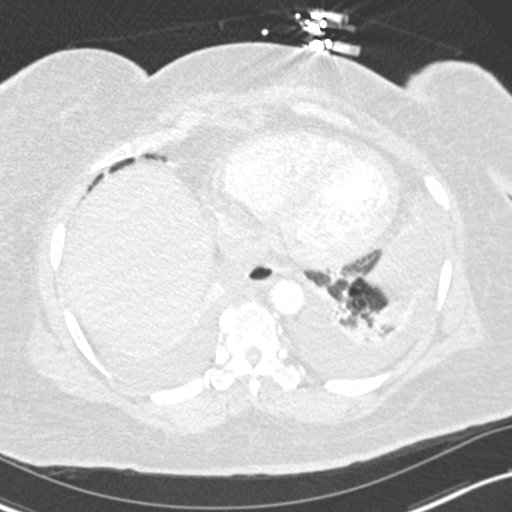
[im 99/252  soft-tissue]
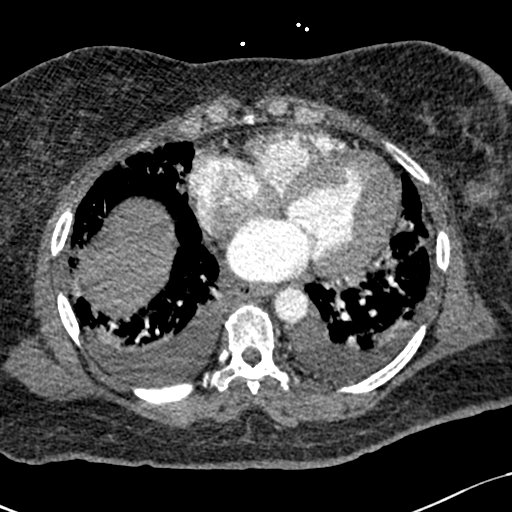
[im 110/252  lung]
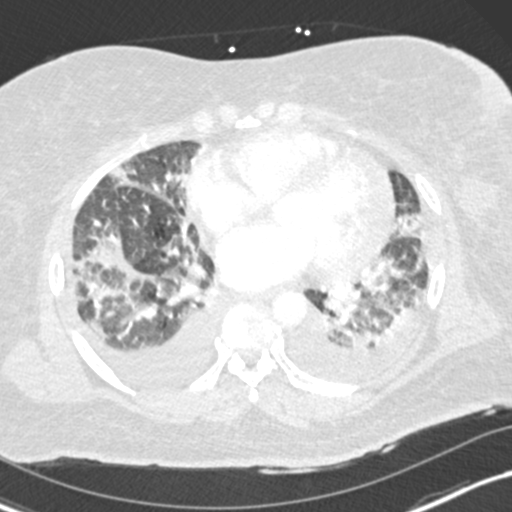
[im 131/252  soft-tissue]
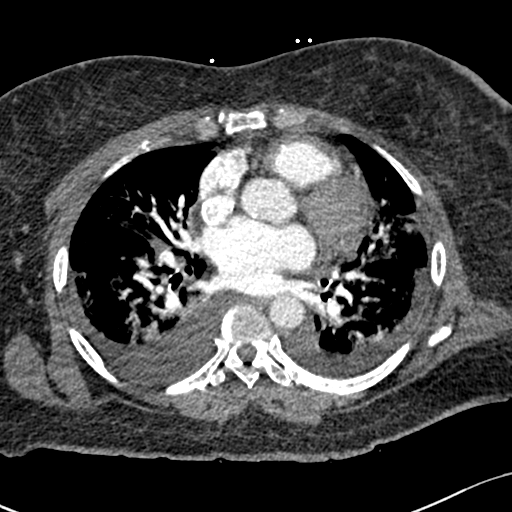
[im 142/252  lung]
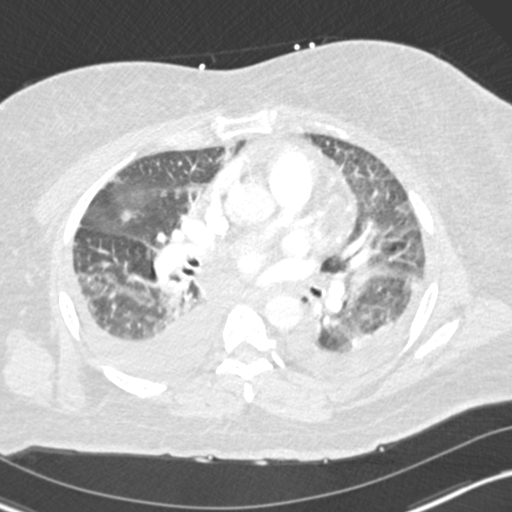
[im 153/252  soft-tissue]
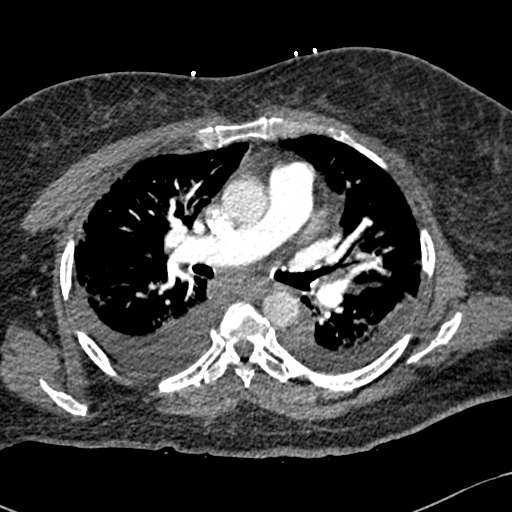
[im 175/252  lung]
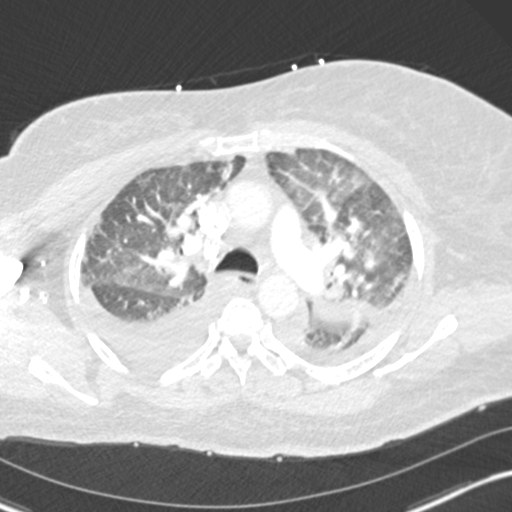
[im 186/252  soft-tissue]
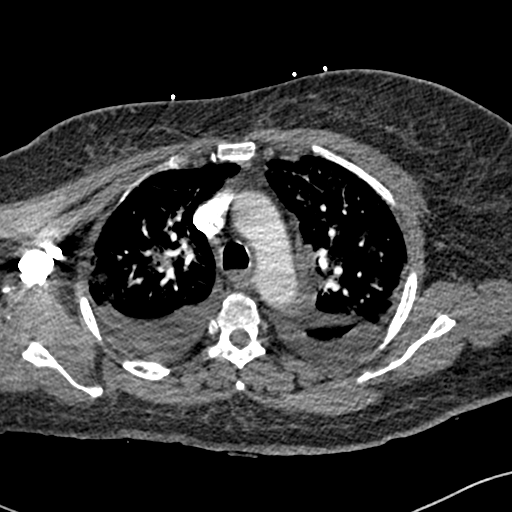
[im 208/252  lung]
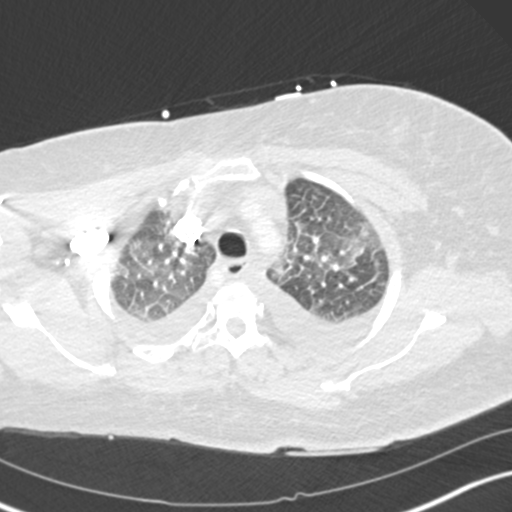
[im 219/252  soft-tissue]
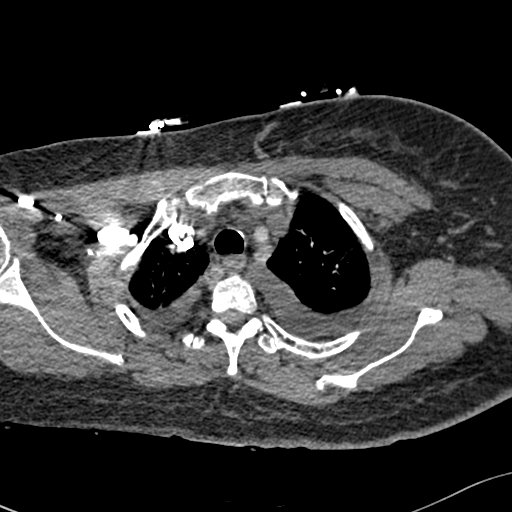
[im 241/252  lung]
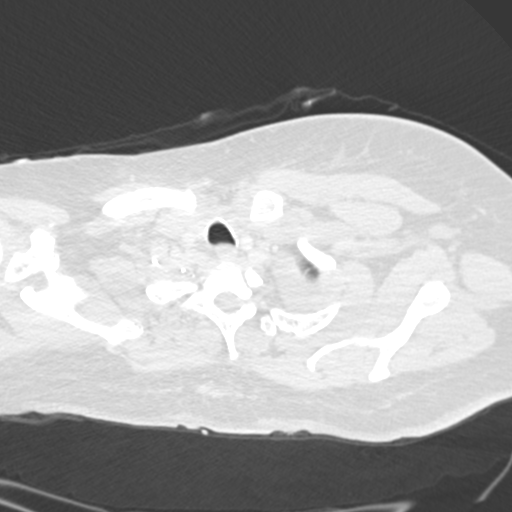

[Series 7: coronal mpr · coronal · 0.49mm/px · 3 of 87 slices shown]
[im 22/87  soft-tissue]
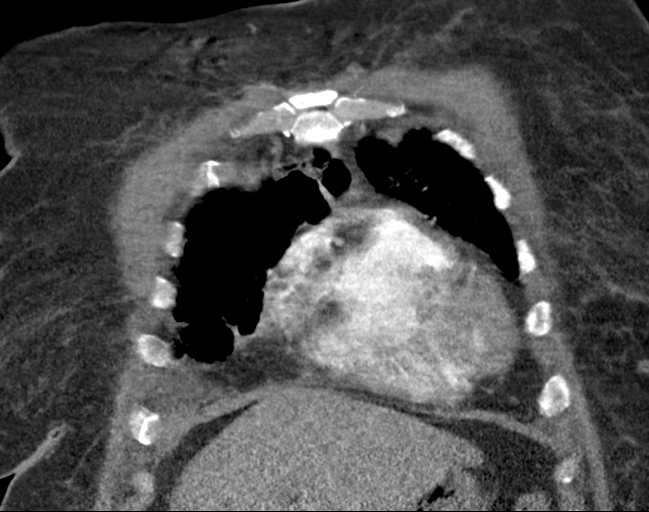
[im 44/87  soft-tissue]
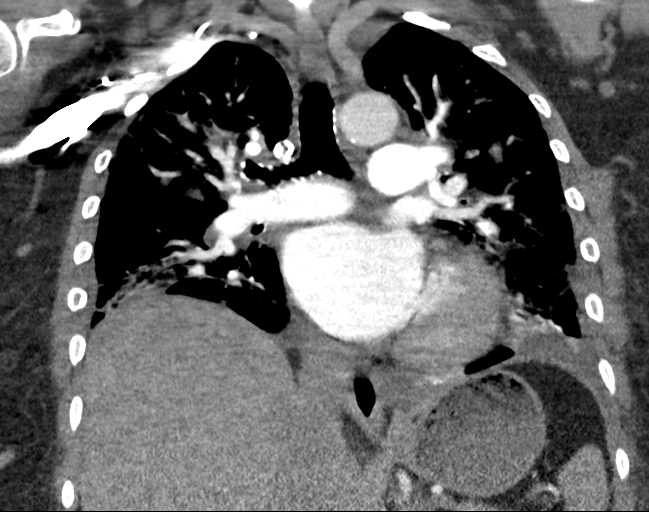
[im 65/87  soft-tissue]
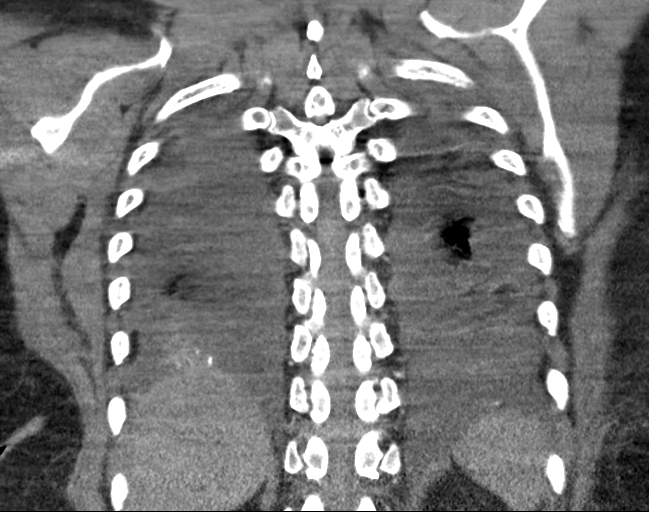

[18 of 46 positions shown; findings below may reference images not displayed]

FINDINGS: Cardiovascular: Thoracic aorta shows no aneurysmal dilatation or
dissection. Pulmonary artery shows a normal branching pattern. No
definitive filling defect to suggest pulmonary embolism is seen. No
significant coronary calcifications are noted. Mild cardiomegaly is
noted.

Mediastinum/Nodes: Thoracic inlet is within normal limits. Scattered
small hilar and mediastinal lymph nodes are noted likely reactive in
nature. The esophagus as visualized is within normal limits.

Lungs/Pleura: Lungs are well aerated bilaterally. Some interstitial
edema is noted as well as patchy airspace opacities bilaterally
consistent with the given clinical history of 4RSS0-6W positivity.
Bilateral pleural effusions are noted. Mild lower lobe atelectatic
changes are noted.

Upper Abdomen: Visualized upper abdomen is unremarkable.

Musculoskeletal: Degenerative changes of the thoracic spine are
noted.

Review of the MIP images confirms the above findings.
IMPRESSION: Patchy airspace opacities consistent with the given clinical history
of 4RSS0-6W positivity.

Bilateral pleural effusions as well as interstitial edema consistent
with superimposed CHF.

No evidence of pulmonary emboli.
